# Patient Record
Sex: Female | Born: 1960 | Race: White | Hispanic: No | Marital: Single | Smoking: Former smoker
Health system: Southern US, Community
[De-identification: ages and names within clinical notes are randomized; demographics above are authoritative.]

## PROBLEM LIST (undated history)

## (undated) DIAGNOSIS — F410 Panic disorder [episodic paroxysmal anxiety] without agoraphobia: Secondary | ICD-10-CM

## (undated) DIAGNOSIS — F419 Anxiety disorder, unspecified: Secondary | ICD-10-CM

## (undated) HISTORY — PX: CHOLECYSTECTOMY: SHX55

## (undated) HISTORY — PX: TONSILLECTOMY: SUR1361

## (undated) HISTORY — PX: CARPAL TUNNEL RELEASE: SHX101

---

## 1997-11-03 ENCOUNTER — Other Ambulatory Visit: Admission: RE | Admit: 1997-11-03 | Discharge: 1997-11-03 | Payer: Self-pay | Admitting: Obstetrics and Gynecology

## 2006-01-13 ENCOUNTER — Ambulatory Visit (HOSPITAL_COMMUNITY): Admission: RE | Admit: 2006-01-13 | Discharge: 2006-01-14 | Payer: Self-pay | Admitting: General Surgery

## 2006-01-13 ENCOUNTER — Encounter (INDEPENDENT_AMBULATORY_CARE_PROVIDER_SITE_OTHER): Payer: Self-pay | Admitting: Specialist

## 2007-08-06 ENCOUNTER — Other Ambulatory Visit: Admission: RE | Admit: 2007-08-06 | Discharge: 2007-08-06 | Payer: Self-pay | Admitting: Obstetrics and Gynecology

## 2010-08-26 ENCOUNTER — Encounter (HOSPITAL_BASED_OUTPATIENT_CLINIC_OR_DEPARTMENT_OTHER)
Admission: RE | Admit: 2010-08-26 | Discharge: 2010-08-26 | Disposition: A | Discharge status: Home / Self Care | Payer: BC Managed Care – PPO | Source: Ambulatory Visit | Attending: Orthopedic Surgery | Admitting: Orthopedic Surgery

## 2010-08-26 LAB — BASIC METABOLIC PANEL
CO2: 32 mEq/L (ref 19–32)
Calcium: 9.4 mg/dL (ref 8.4–10.5)
Creatinine, Ser: 0.43 mg/dL (ref 0.4–1.2)
GFR calc Af Amer: >60 mL/min (ref >60)
GFR calc non Af Amer: >60 mL/min (ref >60)
Sodium: 135 mEq/L (ref 135–145)

## 2010-08-27 ENCOUNTER — Ambulatory Visit (HOSPITAL_BASED_OUTPATIENT_CLINIC_OR_DEPARTMENT_OTHER)
Admission: RE | Admit: 2010-08-27 | Discharge: 2010-08-27 | Disposition: A | Discharge status: Home / Self Care | Payer: BC Managed Care – PPO | Source: Ambulatory Visit | Attending: Orthopedic Surgery | Admitting: Orthopedic Surgery

## 2010-08-27 DIAGNOSIS — G5600 Carpal tunnel syndrome, unspecified upper limb: Secondary | ICD-10-CM | POA: Insufficient documentation

## 2010-08-27 DIAGNOSIS — M654 Radial styloid tenosynovitis [de Quervain]: Secondary | ICD-10-CM | POA: Insufficient documentation

## 2010-08-27 DIAGNOSIS — E669 Obesity, unspecified: Secondary | ICD-10-CM | POA: Insufficient documentation

## 2010-08-27 DIAGNOSIS — I10 Essential (primary) hypertension: Secondary | ICD-10-CM | POA: Insufficient documentation

## 2010-08-27 DIAGNOSIS — E119 Type 2 diabetes mellitus without complications: Secondary | ICD-10-CM | POA: Insufficient documentation

## 2010-08-27 DIAGNOSIS — M1990 Unspecified osteoarthritis, unspecified site: Secondary | ICD-10-CM | POA: Insufficient documentation

## 2010-08-27 DIAGNOSIS — Z01812 Encounter for preprocedural laboratory examination: Secondary | ICD-10-CM | POA: Insufficient documentation

## 2010-08-27 DIAGNOSIS — Z01818 Encounter for other preprocedural examination: Secondary | ICD-10-CM | POA: Insufficient documentation

## 2010-08-27 LAB — GLUCOSE, CAPILLARY: Glucose-Capillary: 131 mg/dL — ABNORMAL HIGH (ref 70–99)

## 2010-08-27 LAB — POCT HEMOGLOBIN-HEMACUE: Hemoglobin: 13.9 g/dL (ref 12.0–15.0)

## 2010-08-31 NOTE — Op Note (Signed)
NAMERABECCA, BIRGE               ACCOUNT NO.:  [id number]  MEDICAL RECORD NO.:  [id number]           PATIENT TYPE:  LOCATION:                                 FACILITY:  PHYSICIAN:  Katy Fitch. Lorenzo Pereyra, M.D.      DATE OF BIRTH:  DATE OF PROCEDURE:  08/27/2010 DATE OF DISCHARGE:                              OPERATIVE REPORT   PREOPERATIVE DIAGNOSES: 1. Chronic right carpal tunnel syndrome associated with type 2     diabetes. 2. Acute stenosing tenosynovitis of right first dorsal compartment.  POSTOPERATIVE DIAGNOSES: 1. Chronic right carpal tunnel syndrome associated with type 2     diabetes. 2. Acute stenosing tenosynovitis of right first dorsal compartment.  OPERATIONS: 1. Release of right transcarpal ligament. 2. Release of right first dorsal compartment.  OPERATIONS:  Katy Fitch. Lemarcus Baggerly, MD  ASSISTANT:  Marveen Reeks Dasnoit, PA-C  ANESTHESIA:  General by LMA.  SUPERVISING ANESTHESIOLOGIST:  Janetta Hora. 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 Creola Corn of [facility] for evaluation and management of hand numbness.  Ms. Madelaine Etienne has type 2 diabetes.  She had clinical examination and electrodiagnostic studies noting significant right carpal tunnel syndrome.  She was scheduled for release of right transcarpal ligament at this time.  Preoperatively, we had a detailed informed consent in the holding area. During this consent, we repeated her clinical examination.  She was noted to have acute pain and swelling of the first dorsal compartment. We explained that stenosing tenosynovitis of the common comorbidity and the presence of diabetes and repetitive motion with carpal tunnel syndrome.  Indeed, she had all the clinical signs of significant first dorsal compartment stenosing tenosynovitis including possible Finkelstein maneuver and tenderness on direct palpation.  She could not ulnarly deviate  her wrist or radially abduct her thumb without significant pain.  We advised that we could proceed with release of first dorsal compartment as well as release of the transverse carpal ligament at this time.  Questions were invited and answered in detail.  Permit was amended appropriately.  Questions were invited and answered in detail with her mother present.  PROCEDURE:  Karlissa Aron was brought to room #1 of the [facility] and placed in supine position upon the operating table.  Following induction of general anesthesia by LMA technique under Dr. Thornton Dales direct supervision, the right arm was prepped with Betadine soap and solution, sterilely draped.  A pneumatic tourniquet was applied to the proximal brachium.  The tourniquet was set at 220 mmHg.  Following a routine surgical time-out, the right arm was exsanguinated with an Esmarch bandage and the arterial tourniquet was inflated to 220 mmHg.  Procedure commenced with a short incision in the line of the ring finger and the palm.  Subcutaneous tissues were carefully divided revealing the palmar fascia.  This was split longitudinally to reveal the common sensory branch of the median nerve.  This was followed back to the median nerve proper which was gently isolated from the transverse carpal ligament with a Agricultural engineer.  The ligament was then  released with scissors extending into the distal forearm.  This widely opened the carpal canal.  No mass or other predicaments were noted.  Bleeding points were not problematic.  The wounds were then repaired with intradermal 3-0 Prolene suture.  Attention was directed to the first dorsal compartment.  The extensor retinaculum was palpably thickened.  An incision was fashioned over the area of maximum swelling.  Subcutaneous tissues were carefully divided taking care to identify and gently retract the radial superficial sensory branches.  The compartment was split  at its apex and 3 tendons were identified. There was no septum present between the extensor pollicis brevis and abductor pollicis longus tendons.  Free range of motion of the wrist was recovered.  The wound was then repaired with intradermal 3-0  Prolene and Steri-Strips.  Compressive dressing was applied with Steri-Strips, sterile gauze, sterile Webril, and a volar plaster splint with the wrist in 10 degrees of dorsiflexion.  For aftercare, Ms. Sarbis was provided a prescription for Vicodin 5 mg 1 p.o. q.4-6 h. p.r.n. pain, 20 tablets without refill.  We will see her back for followup in our office in 1 week for suture removal and initiation of her postoperative rehabilitation program.     Katy Fitch. Lolly Glaus, M.D.     RVS/MEDQ  D:  08/27/2010  T:  08/28/2010  Job:  [id number]  cc:   Gwen Pounds, MD  Electronically Signed by Josephine Igo M.D. on 08/31/2010 02:21:29 PM

## 2010-10-29 NOTE — Op Note (Signed)
NAMEESTELENE, CARMACK               ACCOUNT NO.:  [id number]   MEDICAL RECORD NO.:  [id number]          PATIENT TYPE:  OIB   LOCATION:  1607                         FACILITY:  [facility]   PHYSICIAN:  Ollen Gross. Vernell Morgans, M.D. DATE OF BIRTH:  1961/05/29   DATE OF PROCEDURE:  01/13/2006  DATE OF DISCHARGE:  01/14/2006                                 OPERATIVE REPORT   PREOPERATIVE DIAGNOSIS:  Gallstones.   POSTOPERATIVE DIAGNOSIS:  Gallstones.   PROCEDURE:  Laparoscopic cholecystectomy with intraoperative cholangiogram.   SURGEON:  Dr. Carolynne Edouard.   ASSISTANT:  Dr. Orson Slick.   ANESTHESIA:  General endotracheal.   PROCEDURE:  After informed consent was obtained, the patient was brought to  the operating room and placed in the supine position on the operating room  table.  After adequate induction of general anesthesia, the patient's  abdomen was prepped with Betadine and draped in usual sterile manner.  The  area below the umbilicus infiltrated with 0.25% Marcaine.  A small incision  was made with a 15-blade knife.  This incision was carried down through the  subcutaneous tissue bluntly, with a hemostat and Army-Navy retractors, until  the linea alba was identified.  The linea alba was incised with the 15-blade  knife and each side was grasped Kocher clamps and elevated anteriorly.  The  preperitoneal space was probed bluntly with a hemostat until the peritoneum  was opened and access was gained to the abdominal cavity.  A 0-0 Vicryl  pursestring stitch was placed in the fascia surrounding the opening.  Hasson  cannula was placed through the opening and anchored in place with the  previously placed Vicryl pursestring stitch.  The abdomen was then  insufflated carbon dioxide without difficulty.  The patient was placed in  head-up position, rotated slightly with the right side up.  The laparoscope  was inserted through the Hasson cannula, and the right upper quadrant was  inspected.  Dome of  gallbladder and liver were readily identified.  Next,  the epigastric region was infiltrated 0.25% Marcaine.  A small incision was  made with a 15-blade knife and a 10-mm port was placed bluntly through this  incision, into the abdominal cavity, under direct vision.  Sites were then  chosen laterally on the right side of the abdomen for placement of 5-mm  ports.  Each of these areas were infiltrated with 0.25% Marcaine.  Small  stab incisions were made with a 15-blade knife and 5-mm ports were placed  bluntly through these incisions, into the abdominal cavity, under direct  vision.  Blunt grasper was placed through the lateral-most, 5-mm port and  used to grasp the dome of gallbladder and elevate it anteriorly and  superiorly.  Another blunt grasper was placed through the other 5-mm port  and used to retract on the body and neck of the gallbladder.  A dissector  was placed through the epigastric port and, using the electrocautery, the  peritoneal reflection at the gallbladder neck was opened.  Blunt dissection  was then carried out in this area until the gallbladder neck/cystic duct  junction was  readily identified and a good window was created.  A single  clip was placed on the gallbladder neck.  A small ductotomy was made just  below the clip.  A 14-gauge Angiocath was then placed percutaneously through  the anterior abdominal wall, under direct vision.  A Reddick cholangiogram  catheter was placed through the Angiocath and flushed.  The Reddick catheter  was then placed within the cystic duct and anchored in place with a clip.  Cholangiogram was obtained that showed no filling defects, good emptying  into the duodenum, and adequate length on the cystic duct.  The anchoring  clip and catheter were then removed from the patient.  Three clips were  placed proximally on the cystic duct and the duct was divided beneath the  two sets of clips.  Posterior to this, the cystic artery was  identified and  again dissected bluntly, in a circumferential manner until a good window was  created.  Two clips were placed proximally and one distally on the artery,  and the artery was divided between the two.  Next, a laparoscopic hook  cautery device was used to separate the gallbladder from the liver bed.  Prior to completely detaching the gallbladder from liver bed, the liver bed  was inspected and several small bleeding points were coagulated with the  electrocautery until the area was completely hemostatic.  The gallbladder  was then detached the rest of the way from the liver bed without difficulty,  with the hook electrocautery.  The laparoscope was then moved to the  epigastric port.  A gallbladder grasper was placed through the Hasson  cannula and used to grasp the neck of the gallbladder.  The gallbladder with  the Hasson cannula was then removed through the infraumbilical port without  difficulty.  The fascial defect was closed with the previously placed Vicryl  pursestring stitch, as well as with another figure-of-eight 0-0 Vicryl  stitch, as well as with another figure-of-eight 0-0 Vicryl stitch.  The  liver bed was inspected again and found be hemostatic.  The abdomen was  irrigated with copious amounts of saline until the effluent was clear.  The  rest of ports were removed under direct vision and were found be hemostatic.  The gas was allowed to escape.  The skin incisions were all closed with  interrupted 4-0 Monocryl subcuticular stitches.  Benzoin, Steri-Strips and  sterile dressings were applied.  The patient tolerated the procedure well.  At the end of the case all needle, sponge and instrument counts were  correct.  The patient was then awakened and taken to recovery in stable  condition.      Ollen Gross. Vernell Morgans, M.D.  Electronically Signed     PST/MEDQ  D:  01/19/2006  T:  01/19/2006  Job:  [id number]

## 2010-11-16 ENCOUNTER — Emergency Department (HOSPITAL_COMMUNITY)
Admission: EM | Admit: 2010-11-16 | Discharge: 2010-11-16 | Disposition: A | Discharge status: Home / Self Care | Payer: BC Managed Care – PPO | Attending: Emergency Medicine | Admitting: Emergency Medicine

## 2010-11-16 DIAGNOSIS — F411 Generalized anxiety disorder: Secondary | ICD-10-CM | POA: Insufficient documentation

## 2010-11-16 DIAGNOSIS — I10 Essential (primary) hypertension: Secondary | ICD-10-CM | POA: Insufficient documentation

## 2010-11-16 DIAGNOSIS — R21 Rash and other nonspecific skin eruption: Secondary | ICD-10-CM | POA: Insufficient documentation

## 2010-11-16 DIAGNOSIS — E7800 Pure hypercholesterolemia, unspecified: Secondary | ICD-10-CM | POA: Insufficient documentation

## 2010-11-16 DIAGNOSIS — E119 Type 2 diabetes mellitus without complications: Secondary | ICD-10-CM | POA: Insufficient documentation

## 2010-11-16 DIAGNOSIS — L301 Dyshidrosis [pompholyx]: Secondary | ICD-10-CM | POA: Insufficient documentation

## 2011-09-16 ENCOUNTER — Emergency Department (HOSPITAL_COMMUNITY)
Admission: EM | Admit: 2011-09-16 | Discharge: 2011-09-17 | Disposition: A | Discharge status: Other Acute Inpatient Hospital | Payer: BC Managed Care – PPO | Attending: Emergency Medicine | Admitting: Emergency Medicine

## 2011-09-16 ENCOUNTER — Other Ambulatory Visit: Payer: Self-pay

## 2011-09-16 ENCOUNTER — Encounter (HOSPITAL_COMMUNITY): Payer: Self-pay | Admitting: Emergency Medicine

## 2011-09-16 DIAGNOSIS — T424X4A Poisoning by benzodiazepines, undetermined, initial encounter: Secondary | ICD-10-CM | POA: Insufficient documentation

## 2011-09-16 DIAGNOSIS — R45851 Suicidal ideations: Secondary | ICD-10-CM

## 2011-09-16 DIAGNOSIS — IMO0002 Reserved for concepts with insufficient information to code with codable children: Secondary | ICD-10-CM | POA: Insufficient documentation

## 2011-09-16 DIAGNOSIS — F410 Panic disorder [episodic paroxysmal anxiety] without agoraphobia: Secondary | ICD-10-CM | POA: Insufficient documentation

## 2011-09-16 DIAGNOSIS — T43502A Poisoning by unspecified antipsychotics and neuroleptics, intentional self-harm, initial encounter: Secondary | ICD-10-CM | POA: Insufficient documentation

## 2011-09-16 DIAGNOSIS — X789XXA Intentional self-harm by unspecified sharp object, initial encounter: Secondary | ICD-10-CM | POA: Insufficient documentation

## 2011-09-16 DIAGNOSIS — T50902A Poisoning by unspecified drugs, medicaments and biological substances, intentional self-harm, initial encounter: Secondary | ICD-10-CM

## 2011-09-16 DIAGNOSIS — E119 Type 2 diabetes mellitus without complications: Secondary | ICD-10-CM | POA: Insufficient documentation

## 2011-09-16 DIAGNOSIS — F411 Generalized anxiety disorder: Secondary | ICD-10-CM | POA: Insufficient documentation

## 2011-09-16 DIAGNOSIS — S60819A Abrasion of unspecified wrist, initial encounter: Secondary | ICD-10-CM

## 2011-09-16 HISTORY — DX: Panic disorder (episodic paroxysmal anxiety): F41.0

## 2011-09-16 HISTORY — DX: Anxiety disorder, unspecified: F41.9

## 2011-09-16 LAB — BASIC METABOLIC PANEL
BUN: 12 mg/dL (ref 6–23)
Calcium: 9.6 mg/dL (ref 8.4–10.5)
GFR calc Af Amer: >90 mL/min (ref >90)
GFR calc non Af Amer: >90 mL/min (ref >90)
Glucose, Bld: 158 mg/dL — ABNORMAL HIGH (ref 70–99)

## 2011-09-16 LAB — ETHANOL: Alcohol, Ethyl (B): <11 mg/dL (ref 0–11)

## 2011-09-16 LAB — RAPID URINE DRUG SCREEN, HOSP PERFORMED
Amphetamines: NOT DETECTED
Barbiturates: NOT DETECTED
Tetrahydrocannabinol: NOT DETECTED

## 2011-09-16 LAB — CBC
HCT: 38.1 % (ref 36.0–46.0)
Hemoglobin: 13.9 g/dL (ref 12.0–15.0)
MCH: 31.4 pg (ref 26.0–34.0)
MCHC: 36.5 g/dL — ABNORMAL HIGH (ref 30.0–36.0)
RDW: 12.5 % (ref 11.5–15.5)

## 2011-09-16 MED ORDER — ACETAMINOPHEN 325 MG PO TABS
650.0000 mg | ORAL_TABLET | ORAL | Status: DC | PRN
  Administered 2011-09-16: 650 mg via ORAL
  Filled 2011-09-16: qty 2

## 2011-09-16 MED ORDER — ONDANSETRON HCL 4 MG PO TABS: 4.0000 mg | ORAL_TABLET | Freq: Three times a day (TID) | ORAL | Status: DC | PRN

## 2011-09-16 MED ORDER — ZOLPIDEM TARTRATE 5 MG PO TABS: 5.0000 mg | ORAL_TABLET | Freq: Every evening | ORAL | Status: DC | PRN

## 2011-09-16 MED ORDER — IBUPROFEN 600 MG PO TABS: 600.0000 mg | ORAL_TABLET | Freq: Three times a day (TID) | ORAL | Status: DC | PRN

## 2011-09-16 NOTE — ED Notes (Signed)
To ED via [facility]-- c/o ativan overdose-- took "a handful- 5 or 10" per patient. Denies taking any other medicine, also has superficial cuts to left wrist
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
## 2011-09-16 NOTE — ED Notes (Addendum)
Pt alert and oriented x4. Respirations even and unlabored, bilateral symmetrical rise and fall of chest. Skin warm and dry. In no acute distress. Denies needs.  Pt on menstrual period. Provided with disposable briefs, pad, and new paper scrub pants.

## 2011-09-16 NOTE — ED Notes (Signed)
Lorazepam bottle with pills given to [facility]. Taken to Pharmacy to be counted and stored.

## 2011-09-16 NOTE — ED Notes (Signed)
rn will get report and move pt to pysch ed. md medically cleared pt
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
## 2011-09-16 NOTE — BH Assessment (Signed)
Assessment Note   Pt has been accepted to [facility] by Dr. Abbey Chatters.   Peggy Ryan A 09/16/2011 9:36 PM

## 2011-09-16 NOTE — ED Notes (Signed)
Pt accepted to [facility]  By Dr Darlys Gales- in the [place] building. Edp Yelverton made aware by [employer]

## 2011-09-16 NOTE — ED Notes (Signed)
Ativan sent to pharmacy- [facility] counted pills--63.5 pills remain. Had rx filled on 3/25.

## 2011-09-16 NOTE — ED Notes (Addendum)
md alerted pt is diabetic. At this time no diabetic protocol ordered. Pt does not take insulin as part of her medication listed.

## 2011-09-17 MED ORDER — LORAZEPAM 0.5 MG PO TABS
0.2500 mg | ORAL_TABLET | Freq: Four times a day (QID) | ORAL | Status: DC
  Administered 2011-09-17: 0.5 mg via ORAL
  Filled 2011-09-17: qty 1

## 2011-09-17 MED ORDER — GLIMEPIRIDE 1 MG PO TABS: 1.0000 mg | ORAL_TABLET | Freq: Every day | ORAL | Status: DC

## 2011-09-17 MED ORDER — METFORMIN HCL 500 MG PO TABS
500.0000 mg | ORAL_TABLET | Freq: Two times a day (BID) | ORAL | Status: DC
  Filled 2011-09-17: qty 1

## 2011-09-17 MED ORDER — LISINOPRIL 10 MG PO TABS
10.0000 mg | ORAL_TABLET | Freq: Every day | ORAL | Status: DC
  Administered 2011-09-17: 10 mg via ORAL
  Filled 2011-09-17: qty 1

## 2011-09-17 MED ORDER — HYDROCHLOROTHIAZIDE 25 MG PO TABS
25.0000 mg | ORAL_TABLET | Freq: Every day | ORAL | Status: DC
  Administered 2011-09-17: 25 mg via ORAL
  Filled 2011-09-17: qty 1

## 2011-09-17 MED ORDER — BUPROPION HCL 75 MG PO TABS
75.0000 mg | ORAL_TABLET | Freq: Two times a day (BID) | ORAL | Status: DC
  Administered 2011-09-17: 37.5 mg via ORAL
  Filled 2011-09-17 (×2): qty 1

## 2011-09-17 NOTE — ED Notes (Signed)
Confirmed with [facility] Intake department that pt is IVC for transportation purposes and she's going to the [facility].
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
## 2011-09-17 NOTE — ED Notes (Signed)
[facility] not able to transport pt due to no medical necessity Judeth [place] act made aware and will fill out IVC papers for transport in the am by [facility] department

## 2011-09-17 NOTE — ED Notes (Signed)
Pt has been transported to [facility] via [place] due to [employer].

## 2011-09-17 NOTE — ED Notes (Signed)
Pt was very upset, tearful, asking for her home medications that hadn't been ordered for her psychiatric meds, HTN and DM II. MD ordered meds.

## 2014-08-05 DIAGNOSIS — F332 Major depressive disorder, recurrent severe without psychotic features: Secondary | ICD-10-CM | POA: Diagnosis not present

## 2014-09-02 DIAGNOSIS — Z6831 Body mass index (BMI) 31.0-31.9, adult: Secondary | ICD-10-CM | POA: Diagnosis not present

## 2014-09-02 DIAGNOSIS — M25511 Pain in right shoulder: Secondary | ICD-10-CM | POA: Diagnosis not present

## 2014-10-28 DIAGNOSIS — M25511 Pain in right shoulder: Secondary | ICD-10-CM | POA: Diagnosis not present

## 2014-10-28 DIAGNOSIS — M47812 Spondylosis without myelopathy or radiculopathy, cervical region: Secondary | ICD-10-CM | POA: Diagnosis not present

## 2015-01-05 DIAGNOSIS — I10 Essential (primary) hypertension: Secondary | ICD-10-CM | POA: Diagnosis not present

## 2015-01-05 DIAGNOSIS — E785 Hyperlipidemia, unspecified: Secondary | ICD-10-CM | POA: Diagnosis not present

## 2015-01-05 DIAGNOSIS — E119 Type 2 diabetes mellitus without complications: Secondary | ICD-10-CM | POA: Diagnosis not present

## 2015-01-13 DIAGNOSIS — Z6832 Body mass index (BMI) 32.0-32.9, adult: Secondary | ICD-10-CM | POA: Diagnosis not present

## 2015-01-13 DIAGNOSIS — F329 Major depressive disorder, single episode, unspecified: Secondary | ICD-10-CM | POA: Diagnosis not present

## 2015-01-13 DIAGNOSIS — M25511 Pain in right shoulder: Secondary | ICD-10-CM | POA: Diagnosis not present

## 2015-01-13 DIAGNOSIS — E1165 Type 2 diabetes mellitus with hyperglycemia: Secondary | ICD-10-CM | POA: Diagnosis not present

## 2015-01-13 DIAGNOSIS — Z0000 Encounter for general adult medical examination without abnormal findings: Secondary | ICD-10-CM | POA: Diagnosis not present

## 2015-01-13 DIAGNOSIS — E669 Obesity, unspecified: Secondary | ICD-10-CM | POA: Diagnosis not present

## 2015-01-13 DIAGNOSIS — R42 Dizziness and giddiness: Secondary | ICD-10-CM | POA: Diagnosis not present

## 2015-01-13 DIAGNOSIS — K760 Fatty (change of) liver, not elsewhere classified: Secondary | ICD-10-CM | POA: Diagnosis not present

## 2015-01-22 ENCOUNTER — Other Ambulatory Visit: Payer: Self-pay | Admitting: Internal Medicine

## 2015-01-22 DIAGNOSIS — Z1231 Encounter for screening mammogram for malignant neoplasm of breast: Secondary | ICD-10-CM

## 2015-03-03 DIAGNOSIS — F332 Major depressive disorder, recurrent severe without psychotic features: Secondary | ICD-10-CM | POA: Diagnosis not present

## 2015-09-01 DIAGNOSIS — F332 Major depressive disorder, recurrent severe without psychotic features: Secondary | ICD-10-CM | POA: Diagnosis not present

## 2015-09-14 DIAGNOSIS — E668 Other obesity: Secondary | ICD-10-CM | POA: Diagnosis not present

## 2015-09-14 DIAGNOSIS — F329 Major depressive disorder, single episode, unspecified: Secondary | ICD-10-CM | POA: Diagnosis not present

## 2015-09-14 DIAGNOSIS — I10 Essential (primary) hypertension: Secondary | ICD-10-CM | POA: Diagnosis not present

## 2015-09-14 DIAGNOSIS — E1165 Type 2 diabetes mellitus with hyperglycemia: Secondary | ICD-10-CM | POA: Diagnosis not present

## 2015-09-14 DIAGNOSIS — Z6831 Body mass index (BMI) 31.0-31.9, adult: Secondary | ICD-10-CM | POA: Diagnosis not present

## 2016-01-08 DIAGNOSIS — E784 Other hyperlipidemia: Secondary | ICD-10-CM | POA: Diagnosis not present

## 2016-01-08 DIAGNOSIS — I10 Essential (primary) hypertension: Secondary | ICD-10-CM | POA: Diagnosis not present

## 2016-01-08 DIAGNOSIS — E1165 Type 2 diabetes mellitus with hyperglycemia: Secondary | ICD-10-CM | POA: Diagnosis not present

## 2016-01-15 DIAGNOSIS — I10 Essential (primary) hypertension: Secondary | ICD-10-CM | POA: Diagnosis not present

## 2016-01-15 DIAGNOSIS — G5601 Carpal tunnel syndrome, right upper limb: Secondary | ICD-10-CM | POA: Diagnosis not present

## 2016-01-15 DIAGNOSIS — E668 Other obesity: Secondary | ICD-10-CM | POA: Diagnosis not present

## 2016-01-15 DIAGNOSIS — E784 Other hyperlipidemia: Secondary | ICD-10-CM | POA: Diagnosis not present

## 2016-01-15 DIAGNOSIS — I7389 Other specified peripheral vascular diseases: Secondary | ICD-10-CM | POA: Diagnosis not present

## 2016-01-15 DIAGNOSIS — R0789 Other chest pain: Secondary | ICD-10-CM | POA: Diagnosis not present

## 2016-01-15 DIAGNOSIS — E1151 Type 2 diabetes mellitus with diabetic peripheral angiopathy without gangrene: Secondary | ICD-10-CM | POA: Diagnosis not present

## 2016-01-15 DIAGNOSIS — Z0000 Encounter for general adult medical examination without abnormal findings: Secondary | ICD-10-CM | POA: Diagnosis not present

## 2016-01-15 DIAGNOSIS — F329 Major depressive disorder, single episode, unspecified: Secondary | ICD-10-CM | POA: Diagnosis not present

## 2016-01-27 DIAGNOSIS — H524 Presbyopia: Secondary | ICD-10-CM | POA: Diagnosis not present

## 2016-01-27 DIAGNOSIS — E118 Type 2 diabetes mellitus with unspecified complications: Secondary | ICD-10-CM | POA: Diagnosis not present

## 2016-01-27 DIAGNOSIS — H5210 Myopia, unspecified eye: Secondary | ICD-10-CM | POA: Diagnosis not present

## 2016-02-24 DIAGNOSIS — L821 Other seborrheic keratosis: Secondary | ICD-10-CM | POA: Diagnosis not present

## 2016-02-24 DIAGNOSIS — D235 Other benign neoplasm of skin of trunk: Secondary | ICD-10-CM | POA: Diagnosis not present

## 2016-02-24 DIAGNOSIS — L281 Prurigo nodularis: Secondary | ICD-10-CM | POA: Diagnosis not present

## 2016-02-24 DIAGNOSIS — D1801 Hemangioma of skin and subcutaneous tissue: Secondary | ICD-10-CM | POA: Diagnosis not present

## 2016-02-24 DIAGNOSIS — L309 Dermatitis, unspecified: Secondary | ICD-10-CM | POA: Diagnosis not present

## 2016-03-01 DIAGNOSIS — F332 Major depressive disorder, recurrent severe without psychotic features: Secondary | ICD-10-CM | POA: Diagnosis not present

## 2016-03-10 ENCOUNTER — Emergency Department (HOSPITAL_COMMUNITY)
Admission: EM | Admit: 2016-03-10 | Discharge: 2016-03-10 | Disposition: A | Discharge status: Home / Self Care | Payer: Commercial Managed Care - HMO | Attending: Emergency Medicine | Admitting: Emergency Medicine

## 2016-03-10 ENCOUNTER — Encounter (HOSPITAL_COMMUNITY): Payer: Self-pay

## 2016-03-10 DIAGNOSIS — Z87891 Personal history of nicotine dependence: Secondary | ICD-10-CM | POA: Diagnosis not present

## 2016-03-10 DIAGNOSIS — E119 Type 2 diabetes mellitus without complications: Secondary | ICD-10-CM | POA: Insufficient documentation

## 2016-03-10 DIAGNOSIS — Z79899 Other long term (current) drug therapy: Secondary | ICD-10-CM | POA: Diagnosis not present

## 2016-03-10 DIAGNOSIS — Z7984 Long term (current) use of oral hypoglycemic drugs: Secondary | ICD-10-CM | POA: Diagnosis not present

## 2016-03-10 DIAGNOSIS — Z91040 Latex allergy status: Secondary | ICD-10-CM | POA: Insufficient documentation

## 2016-03-10 DIAGNOSIS — G4753 Recurrent isolated sleep paralysis: Secondary | ICD-10-CM | POA: Diagnosis not present

## 2016-03-10 DIAGNOSIS — F419 Anxiety disorder, unspecified: Secondary | ICD-10-CM | POA: Insufficient documentation

## 2016-03-10 DIAGNOSIS — R251 Tremor, unspecified: Secondary | ICD-10-CM | POA: Diagnosis not present

## 2016-03-10 DIAGNOSIS — R1310 Dysphagia, unspecified: Secondary | ICD-10-CM | POA: Diagnosis not present

## 2016-03-10 DIAGNOSIS — I6789 Other cerebrovascular disease: Secondary | ICD-10-CM | POA: Diagnosis not present

## 2016-03-10 DIAGNOSIS — G478 Other sleep disorders: Secondary | ICD-10-CM

## 2016-03-10 LAB — CBG MONITORING, ED: Glucose-Capillary: 263 mg/dL — ABNORMAL HIGH (ref 65–99)

## 2016-03-10 NOTE — ED Triage Notes (Addendum)
Pt states awake from a nightmare. HX of panic attack. (hypervental, shaking, could not speak) Pt states "it felt like an attack" Pt states she feels a thousand times better however concerns about a stroke. EMS ceared NEG FOR STROKE. PT PRESENT NO STROKE SYMPTOMS. Neuro intact. PEERL, No slurred speech. Pt took ativan before arrival to ED. EDPA WILL present to evaluate pt in triage.

## 2016-03-10 NOTE — ED Notes (Signed)
EDPA WILL PRESENT at bedside.

## 2016-03-10 NOTE — ED Notes (Signed)
PER EDPA WILL- NO ORDERS AT THIS TIME. PT CAN BE PLACED BACK IN WAITING AREA

## 2016-03-10 NOTE — ED Provider Notes (Signed)
[facility] DEPT Provider Note   CSN: [id number] Arrival date & time: 03/10/16  1050     History   Chief Complaint Chief Complaint  Patient presents with  . Anxiety    HPI Peggy Ryan is a 55 y.o. female.  Peggy Ryan is a 55 y.o. Female who presents to the ED complaining of anxiety. Patient reports she was having a nightmare when she began to wake up and for a few seconds she felt her whole body was paralyzed. She then was able to move but was very anxious and thought she might have had a stroke. She took some ativan and called EMS who brought her to the ER. She currently reports she is feeling much better. She still feels anxious. She denies any weakness, numbness or tingling. Her earlier symptoms have resolved. She does report a history of panic disorder and feels like she had a panic attack. She denies history of stroke. Patient denies fevers, chest pain, shortness of breath, abdominal pain, nausea, vomiting, diarrhea, rashes, urinary symptoms.    The history is provided by the patient and the EMS personnel. No language interpreter was used.  Anxiety  Pertinent negatives include no chest pain, no abdominal pain, no headaches and no shortness of breath.    Past Medical History:  Diagnosis Date  . Anxiety   . Diabetes mellitus   . Panic attacks     There are no active problems to display for this patient.   Past Surgical History:  Procedure Laterality Date  . CARPAL TUNNEL RELEASE    . CHOLECYSTECTOMY    . TONSILLECTOMY      OB History    No data available       Home Medications    Prior to Admission medications   Medication Sig Start Date End Date Taking? Authorizing Provider  buPROPion (WELLBUTRIN) 75 MG tablet Take 75 mg by mouth 2 (two) times daily.    Historical Provider, MD  doxycycline (VIBRA-TABS) 100 MG tablet Take 100 mg by mouth 2 (two) times daily.    Historical Provider, MD  fenofibrate (TRICOR) 145 MG tablet Take 145 mg by mouth daily.     Historical Provider, MD  glimepiride (AMARYL) 1 MG tablet Take 1 mg by mouth daily before breakfast.    Historical Provider, MD  hydrochlorothiazide (HYDRODIURIL) 25 MG tablet Take 25 mg by mouth daily.    Historical Provider, MD  lisinopril (PRINIVIL,ZESTRIL) 10 MG tablet Take 10 mg by mouth daily.    Historical Provider, MD  LORazepam (ATIVAN) 0.5 MG tablet Take 0.25-0.5 mg by mouth 4 (four) times daily.    Historical Provider, MD  metFORMIN (GLUCOPHAGE) 500 MG tablet Take 500 mg by mouth 2 (two) times daily with a meal.    Historical Provider, MD  tretinoin (RETIN-A) 0.05 % cream Apply 1 application topically at bedtime.    Historical Provider, MD    Family History No family history on file.  Social History Social History  Substance Use Topics  . Smoking status: Former Research scientist (life sciences)  . Smokeless tobacco: Former Systems developer    Quit date: 09/15/2008  . Alcohol use Not on file     Comment: socially     Allergies   Levofloxacin; Latex; and Sulfa antibiotics   Review of Systems Review of Systems  Constitutional: Negative for chills and fever.  HENT: Negative for congestion and sore throat.   Eyes: Negative for visual disturbance.  Respiratory: Negative for cough and shortness of breath.   Cardiovascular:  Negative for chest pain.  Gastrointestinal: Negative for abdominal pain, diarrhea, nausea and vomiting.  Genitourinary: Negative for dysuria.  Musculoskeletal: Negative for back pain and neck pain.  Skin: Negative for rash.  Neurological: Negative for dizziness, syncope, weakness, light-headedness, numbness and headaches.  Psychiatric/Behavioral: Negative for suicidal ideas. The patient is nervous/anxious.      Physical Exam Updated Vital Signs BP 117/70 (BP Location: Right Arm)   Pulse 106   Temp 98.2 F (36.8 C) (Oral)   Resp 18   Ht 5\' 1"  (1.549 m)   Wt 79.4 kg   SpO2 95%   BMI 33.07 kg/m   Physical Exam  Constitutional: She is oriented to person, place, and time. She  appears well-developed and well-nourished. No distress.  Nontoxic appearing.  HENT:  Head: Normocephalic and atraumatic.  Right Ear: External ear normal.  Left Ear: External ear normal.  Mouth/Throat: Oropharynx is clear and moist.  Eyes: Conjunctivae and EOM are normal. Pupils are equal, round, and reactive to light. Right eye exhibits no discharge. Left eye exhibits no discharge.  Neck: Neck supple.  Cardiovascular: Normal rate, regular rhythm, normal heart sounds and intact distal pulses.  Exam reveals no gallop and no friction rub.   No murmur heard. Pulmonary/Chest: Effort normal and breath sounds normal. No respiratory distress. She has no wheezes. She has no rales.  Abdominal: Soft. There is no tenderness. There is no guarding.  Musculoskeletal: Normal range of motion. She exhibits no edema, tenderness or deformity.  Good strength to her bilateral upper and lower extremities. No lower extremity edema or tenderness.  Lymphadenopathy:    She has no cervical adenopathy.  Neurological: She is alert and oriented to person, place, and time. No cranial nerve deficit. Coordination normal.  Patient is alert and oriented 3. Cranial nerves are intact. Speech is clear and coherent. EOMs are intact. Vision is grossly intact. Normal gait. Sensation is intact her bilateral upper and lower extremities. Finger to nose is intact.  Skin: Skin is warm and dry. Capillary refill takes less than 2 seconds. No rash noted. She is not diaphoretic. No erythema. No pallor.  Psychiatric: Her behavior is normal. Her mood appears anxious.  Patient appears anxious.  Nursing note and vitals reviewed.    ED Treatments / Results  Labs (all labs ordered are listed, but only abnormal results are displayed) Labs Reviewed  CBG MONITORING, ED - Abnormal; Notable for the following:       Result Value   Glucose-Capillary 263 (*)    All other components within normal limits    EKG  EKG Interpretation None        Radiology No results found.  Procedures Procedures (including critical care time)  Medications Ordered in ED Medications - No data to display   Initial Impression / Assessment and Plan / ED Course  I have reviewed the triage vital signs and the nursing notes.  Pertinent labs & imaging results that were available during my care of the patient were reviewed by me and considered in my medical decision making (see chart for details).  Clinical Course   This is a 55 y.o. Female who presents to the ED complaining of anxiety. Patient reports she was having a nightmare when she began to wake up and for a few seconds she felt her whole body was paralyzed. She then was able to move but was very anxious and thought she might have had a stroke. She took some ativan and called EMS who brought her  to the ER. She currently reports she is feeling much better. She still feels anxious. She denies any weakness, numbness or tingling. Her earlier symptoms have resolved. She does report a history of panic disorder and feels like she had a panic attack. On exam the patient is afebrile nontoxic appearing. She has no focal neurological deficits. She is able to ambulate without difficulty or assistance. Patient describes sleep paralysis. Patient is likely coming out of REM sleep and had some brief sleep paralysis. She has no focal neurological deficits. She feels back at her baseline. After I explained what I believed happened patient is laughing and reports she feels much better. She is followed by psychiatry. I encouraged her to follow-up with her psychiatrist to discuss the symptoms and possibly discuss medications for nightmares. I discussed return precautions. I advised the patient to follow-up with their primary care provider this week. I advised the patient to return to the emergency department with new or worsening symptoms or new concerns. The patient verbalized understanding and agreement with plan.     This patient was discussed with Dr. Vanita Panda who agrees with assessment and plan.   Final Clinical Impressions(s) / ED Diagnoses   Final diagnoses:  Sleep paralysis  Anxiety    New Prescriptions New Prescriptions   No medications on file     Waynetta Pean, Hershal Coria 03/10/16 1237    Carmin Muskrat, MD 03/10/16 1743

## 2016-03-10 NOTE — ED Notes (Signed)
Bed: WTR5 Expected date:  Expected time:  Means of arrival:  Comments: 

## 2016-03-10 NOTE — Discharge Instructions (Signed)
°   Sleep paralysis is the complete inability to move for one or two minutes immediately after awakening. It may also occur just before falling asleep. Episodes of sleep paralysis can be frightening because the immobility may be accompanied by hypnopompic hallucinations or a sensation of suffocation. The feeling of suffocation may be related to slight reductions in tidal volume that occur during sleep paralysis. Sleep paralysis can be distinguished from cataplexy because sleep paralysis occurs upon awakening, while cataplexy is triggered by positive emotions. Sleep paralysis and hypnagogic hallucinations are common in patients with narcolepsy but not specific for the diagnosis. Isolated sleep paralysis can occur in association with insufficient sleep, irregular sleep-wake schedules, or as a rebound phenomenon after withdrawal of REM sleep-suppressing medications or substances. About 20 percent of the general population have a rare episode of sleep paralysis, perhaps once or twice over several years, but people with narcolepsy tend to have these symptoms much more frequently.

## 2016-09-20 DIAGNOSIS — Z6831 Body mass index (BMI) 31.0-31.9, adult: Secondary | ICD-10-CM | POA: Diagnosis not present

## 2016-09-20 DIAGNOSIS — E668 Other obesity: Secondary | ICD-10-CM | POA: Diagnosis not present

## 2016-09-20 DIAGNOSIS — F17210 Nicotine dependence, cigarettes, uncomplicated: Secondary | ICD-10-CM | POA: Diagnosis not present

## 2016-09-20 DIAGNOSIS — E1165 Type 2 diabetes mellitus with hyperglycemia: Secondary | ICD-10-CM | POA: Diagnosis not present

## 2016-09-20 DIAGNOSIS — I7389 Other specified peripheral vascular diseases: Secondary | ICD-10-CM | POA: Diagnosis not present

## 2016-09-20 DIAGNOSIS — I10 Essential (primary) hypertension: Secondary | ICD-10-CM | POA: Diagnosis not present

## 2016-09-20 DIAGNOSIS — F3289 Other specified depressive episodes: Secondary | ICD-10-CM | POA: Diagnosis not present

## 2016-11-08 DIAGNOSIS — F332 Major depressive disorder, recurrent severe without psychotic features: Secondary | ICD-10-CM | POA: Diagnosis not present

## 2017-02-14 DIAGNOSIS — M9903 Segmental and somatic dysfunction of lumbar region: Secondary | ICD-10-CM | POA: Diagnosis not present

## 2017-02-14 DIAGNOSIS — M461 Sacroiliitis, not elsewhere classified: Secondary | ICD-10-CM | POA: Diagnosis not present

## 2017-02-14 DIAGNOSIS — M9904 Segmental and somatic dysfunction of sacral region: Secondary | ICD-10-CM | POA: Diagnosis not present

## 2017-02-14 DIAGNOSIS — M9905 Segmental and somatic dysfunction of pelvic region: Secondary | ICD-10-CM | POA: Diagnosis not present

## 2017-02-14 DIAGNOSIS — M5137 Other intervertebral disc degeneration, lumbosacral region: Secondary | ICD-10-CM | POA: Diagnosis not present

## 2017-02-16 DIAGNOSIS — M9903 Segmental and somatic dysfunction of lumbar region: Secondary | ICD-10-CM | POA: Diagnosis not present

## 2017-02-16 DIAGNOSIS — M9905 Segmental and somatic dysfunction of pelvic region: Secondary | ICD-10-CM | POA: Diagnosis not present

## 2017-02-16 DIAGNOSIS — M5137 Other intervertebral disc degeneration, lumbosacral region: Secondary | ICD-10-CM | POA: Diagnosis not present

## 2017-02-16 DIAGNOSIS — M461 Sacroiliitis, not elsewhere classified: Secondary | ICD-10-CM | POA: Diagnosis not present

## 2017-02-16 DIAGNOSIS — M9904 Segmental and somatic dysfunction of sacral region: Secondary | ICD-10-CM | POA: Diagnosis not present

## 2017-03-10 DIAGNOSIS — E784 Other hyperlipidemia: Secondary | ICD-10-CM | POA: Diagnosis not present

## 2017-03-10 DIAGNOSIS — E1165 Type 2 diabetes mellitus with hyperglycemia: Secondary | ICD-10-CM | POA: Diagnosis not present

## 2017-03-10 DIAGNOSIS — I10 Essential (primary) hypertension: Secondary | ICD-10-CM | POA: Diagnosis not present

## 2017-03-16 DIAGNOSIS — R0609 Other forms of dyspnea: Secondary | ICD-10-CM | POA: Diagnosis not present

## 2017-03-16 DIAGNOSIS — R0789 Other chest pain: Secondary | ICD-10-CM | POA: Diagnosis not present

## 2017-03-16 DIAGNOSIS — I7389 Other specified peripheral vascular diseases: Secondary | ICD-10-CM | POA: Diagnosis not present

## 2017-03-16 DIAGNOSIS — E668 Other obesity: Secondary | ICD-10-CM | POA: Diagnosis not present

## 2017-03-16 DIAGNOSIS — Z6830 Body mass index (BMI) 30.0-30.9, adult: Secondary | ICD-10-CM | POA: Diagnosis not present

## 2017-03-16 DIAGNOSIS — Z23 Encounter for immunization: Secondary | ICD-10-CM | POA: Diagnosis not present

## 2017-03-16 DIAGNOSIS — Z0000 Encounter for general adult medical examination without abnormal findings: Secondary | ICD-10-CM | POA: Diagnosis not present

## 2017-03-16 DIAGNOSIS — F3289 Other specified depressive episodes: Secondary | ICD-10-CM | POA: Diagnosis not present

## 2017-03-16 DIAGNOSIS — E1165 Type 2 diabetes mellitus with hyperglycemia: Secondary | ICD-10-CM | POA: Diagnosis not present

## 2017-03-16 DIAGNOSIS — R5383 Other fatigue: Secondary | ICD-10-CM | POA: Diagnosis not present

## 2017-03-16 DIAGNOSIS — E7849 Other hyperlipidemia: Secondary | ICD-10-CM | POA: Diagnosis not present

## 2017-03-16 DIAGNOSIS — M545 Low back pain: Secondary | ICD-10-CM | POA: Diagnosis not present

## 2017-03-23 DIAGNOSIS — F332 Major depressive disorder, recurrent severe without psychotic features: Secondary | ICD-10-CM | POA: Diagnosis not present

## 2017-03-24 DIAGNOSIS — Z1212 Encounter for screening for malignant neoplasm of rectum: Secondary | ICD-10-CM | POA: Diagnosis not present

## 2017-04-04 DIAGNOSIS — M79604 Pain in right leg: Secondary | ICD-10-CM | POA: Diagnosis not present

## 2017-04-04 DIAGNOSIS — M6281 Muscle weakness (generalized): Secondary | ICD-10-CM | POA: Diagnosis not present

## 2017-04-04 DIAGNOSIS — R293 Abnormal posture: Secondary | ICD-10-CM | POA: Diagnosis not present

## 2017-04-04 DIAGNOSIS — M545 Low back pain: Secondary | ICD-10-CM | POA: Diagnosis not present

## 2017-04-11 DIAGNOSIS — M6281 Muscle weakness (generalized): Secondary | ICD-10-CM | POA: Diagnosis not present

## 2017-04-11 DIAGNOSIS — M545 Low back pain: Secondary | ICD-10-CM | POA: Diagnosis not present

## 2017-04-11 DIAGNOSIS — M79604 Pain in right leg: Secondary | ICD-10-CM | POA: Diagnosis not present

## 2017-04-11 DIAGNOSIS — R293 Abnormal posture: Secondary | ICD-10-CM | POA: Diagnosis not present

## 2017-04-13 DIAGNOSIS — R293 Abnormal posture: Secondary | ICD-10-CM | POA: Diagnosis not present

## 2017-04-13 DIAGNOSIS — M545 Low back pain: Secondary | ICD-10-CM | POA: Diagnosis not present

## 2017-04-13 DIAGNOSIS — M6281 Muscle weakness (generalized): Secondary | ICD-10-CM | POA: Diagnosis not present

## 2017-04-13 DIAGNOSIS — M79604 Pain in right leg: Secondary | ICD-10-CM | POA: Diagnosis not present

## 2017-07-25 DIAGNOSIS — F332 Major depressive disorder, recurrent severe without psychotic features: Secondary | ICD-10-CM | POA: Diagnosis not present

## 2017-08-21 DIAGNOSIS — M25551 Pain in right hip: Secondary | ICD-10-CM | POA: Diagnosis not present

## 2017-08-21 DIAGNOSIS — M545 Low back pain: Secondary | ICD-10-CM | POA: Diagnosis not present

## 2017-08-21 DIAGNOSIS — R262 Difficulty in walking, not elsewhere classified: Secondary | ICD-10-CM | POA: Diagnosis not present

## 2017-08-21 DIAGNOSIS — M6281 Muscle weakness (generalized): Secondary | ICD-10-CM | POA: Diagnosis not present

## 2017-08-24 DIAGNOSIS — M25551 Pain in right hip: Secondary | ICD-10-CM | POA: Diagnosis not present

## 2017-08-24 DIAGNOSIS — M545 Low back pain: Secondary | ICD-10-CM | POA: Diagnosis not present

## 2017-08-24 DIAGNOSIS — R262 Difficulty in walking, not elsewhere classified: Secondary | ICD-10-CM | POA: Diagnosis not present

## 2017-08-24 DIAGNOSIS — M6281 Muscle weakness (generalized): Secondary | ICD-10-CM | POA: Diagnosis not present

## 2017-08-31 DIAGNOSIS — R262 Difficulty in walking, not elsewhere classified: Secondary | ICD-10-CM | POA: Diagnosis not present

## 2017-08-31 DIAGNOSIS — M25551 Pain in right hip: Secondary | ICD-10-CM | POA: Diagnosis not present

## 2017-08-31 DIAGNOSIS — M545 Low back pain: Secondary | ICD-10-CM | POA: Diagnosis not present

## 2017-08-31 DIAGNOSIS — M6281 Muscle weakness (generalized): Secondary | ICD-10-CM | POA: Diagnosis not present

## 2017-09-07 DIAGNOSIS — M545 Low back pain: Secondary | ICD-10-CM | POA: Diagnosis not present

## 2017-09-07 DIAGNOSIS — R262 Difficulty in walking, not elsewhere classified: Secondary | ICD-10-CM | POA: Diagnosis not present

## 2017-09-07 DIAGNOSIS — M6281 Muscle weakness (generalized): Secondary | ICD-10-CM | POA: Diagnosis not present

## 2017-09-07 DIAGNOSIS — M25551 Pain in right hip: Secondary | ICD-10-CM | POA: Diagnosis not present

## 2017-09-14 DIAGNOSIS — M545 Low back pain: Secondary | ICD-10-CM | POA: Diagnosis not present

## 2017-09-14 DIAGNOSIS — M25551 Pain in right hip: Secondary | ICD-10-CM | POA: Diagnosis not present

## 2017-09-14 DIAGNOSIS — M6281 Muscle weakness (generalized): Secondary | ICD-10-CM | POA: Diagnosis not present

## 2017-09-14 DIAGNOSIS — R262 Difficulty in walking, not elsewhere classified: Secondary | ICD-10-CM | POA: Diagnosis not present

## 2017-09-26 DIAGNOSIS — M6281 Muscle weakness (generalized): Secondary | ICD-10-CM | POA: Diagnosis not present

## 2017-09-26 DIAGNOSIS — M545 Low back pain: Secondary | ICD-10-CM | POA: Diagnosis not present

## 2017-09-26 DIAGNOSIS — R262 Difficulty in walking, not elsewhere classified: Secondary | ICD-10-CM | POA: Diagnosis not present

## 2017-09-26 DIAGNOSIS — M25551 Pain in right hip: Secondary | ICD-10-CM | POA: Diagnosis not present

## 2017-10-03 DIAGNOSIS — M545 Low back pain: Secondary | ICD-10-CM | POA: Diagnosis not present

## 2017-10-03 DIAGNOSIS — M6281 Muscle weakness (generalized): Secondary | ICD-10-CM | POA: Diagnosis not present

## 2017-10-03 DIAGNOSIS — M25551 Pain in right hip: Secondary | ICD-10-CM | POA: Diagnosis not present

## 2017-10-03 DIAGNOSIS — R262 Difficulty in walking, not elsewhere classified: Secondary | ICD-10-CM | POA: Diagnosis not present

## 2017-11-13 DIAGNOSIS — Z6830 Body mass index (BMI) 30.0-30.9, adult: Secondary | ICD-10-CM | POA: Diagnosis not present

## 2017-11-13 DIAGNOSIS — M545 Low back pain: Secondary | ICD-10-CM | POA: Diagnosis not present

## 2017-11-13 DIAGNOSIS — R0609 Other forms of dyspnea: Secondary | ICD-10-CM | POA: Diagnosis not present

## 2017-11-13 DIAGNOSIS — F17210 Nicotine dependence, cigarettes, uncomplicated: Secondary | ICD-10-CM | POA: Diagnosis not present

## 2017-11-13 DIAGNOSIS — F3289 Other specified depressive episodes: Secondary | ICD-10-CM | POA: Diagnosis not present

## 2017-11-13 DIAGNOSIS — R5383 Other fatigue: Secondary | ICD-10-CM | POA: Diagnosis not present

## 2017-11-13 DIAGNOSIS — I10 Essential (primary) hypertension: Secondary | ICD-10-CM | POA: Diagnosis not present

## 2017-11-13 DIAGNOSIS — E668 Other obesity: Secondary | ICD-10-CM | POA: Diagnosis not present

## 2017-11-13 DIAGNOSIS — E1165 Type 2 diabetes mellitus with hyperglycemia: Secondary | ICD-10-CM | POA: Diagnosis not present

## 2020-03-07 ENCOUNTER — Encounter (HOSPITAL_COMMUNITY): Payer: Self-pay

## 2020-03-07 ENCOUNTER — Inpatient Hospital Stay (HOSPITAL_COMMUNITY)
Admission: EM | Admit: 2020-03-07 | Discharge: 2020-03-09 | DRG: 065 | Disposition: A | Discharge status: Home / Self Care | Payer: Medicare Other | Attending: Internal Medicine | Admitting: Internal Medicine

## 2020-03-07 ENCOUNTER — Other Ambulatory Visit: Payer: Self-pay

## 2020-03-07 ENCOUNTER — Emergency Department (HOSPITAL_COMMUNITY): Payer: Medicare Other

## 2020-03-07 DIAGNOSIS — Z7984 Long term (current) use of oral hypoglycemic drugs: Secondary | ICD-10-CM

## 2020-03-07 DIAGNOSIS — E1169 Type 2 diabetes mellitus with other specified complication: Secondary | ICD-10-CM | POA: Diagnosis present

## 2020-03-07 DIAGNOSIS — Z91040 Latex allergy status: Secondary | ICD-10-CM

## 2020-03-07 DIAGNOSIS — E118 Type 2 diabetes mellitus with unspecified complications: Secondary | ICD-10-CM | POA: Diagnosis present

## 2020-03-07 DIAGNOSIS — R29810 Facial weakness: Secondary | ICD-10-CM | POA: Diagnosis present

## 2020-03-07 DIAGNOSIS — Z888 Allergy status to other drugs, medicaments and biological substances status: Secondary | ICD-10-CM

## 2020-03-07 DIAGNOSIS — R471 Dysarthria and anarthria: Secondary | ICD-10-CM | POA: Diagnosis present

## 2020-03-07 DIAGNOSIS — E785 Hyperlipidemia, unspecified: Secondary | ICD-10-CM | POA: Diagnosis present

## 2020-03-07 DIAGNOSIS — I69351 Hemiplegia and hemiparesis following cerebral infarction affecting right dominant side: Secondary | ICD-10-CM

## 2020-03-07 DIAGNOSIS — I639 Cerebral infarction, unspecified: Principal | ICD-10-CM | POA: Diagnosis present

## 2020-03-07 DIAGNOSIS — E782 Mixed hyperlipidemia: Secondary | ICD-10-CM | POA: Diagnosis present

## 2020-03-07 DIAGNOSIS — R079 Chest pain, unspecified: Secondary | ICD-10-CM | POA: Diagnosis present

## 2020-03-07 DIAGNOSIS — F329 Major depressive disorder, single episode, unspecified: Secondary | ICD-10-CM | POA: Diagnosis present

## 2020-03-07 DIAGNOSIS — R270 Ataxia, unspecified: Secondary | ICD-10-CM | POA: Diagnosis present

## 2020-03-07 DIAGNOSIS — F419 Anxiety disorder, unspecified: Secondary | ICD-10-CM | POA: Diagnosis present

## 2020-03-07 DIAGNOSIS — F410 Panic disorder [episodic paroxysmal anxiety] without agoraphobia: Secondary | ICD-10-CM | POA: Diagnosis present

## 2020-03-07 DIAGNOSIS — Z87891 Personal history of nicotine dependence: Secondary | ICD-10-CM

## 2020-03-07 DIAGNOSIS — R29898 Other symptoms and signs involving the musculoskeletal system: Secondary | ICD-10-CM | POA: Diagnosis present

## 2020-03-07 DIAGNOSIS — E1165 Type 2 diabetes mellitus with hyperglycemia: Secondary | ICD-10-CM | POA: Diagnosis present

## 2020-03-07 DIAGNOSIS — R29705 NIHSS score 5: Secondary | ICD-10-CM | POA: Diagnosis present

## 2020-03-07 DIAGNOSIS — I10 Essential (primary) hypertension: Secondary | ICD-10-CM | POA: Diagnosis present

## 2020-03-07 DIAGNOSIS — R4701 Aphasia: Secondary | ICD-10-CM | POA: Diagnosis present

## 2020-03-07 DIAGNOSIS — Z79899 Other long term (current) drug therapy: Secondary | ICD-10-CM

## 2020-03-07 DIAGNOSIS — Z20822 Contact with and (suspected) exposure to covid-19: Secondary | ICD-10-CM | POA: Diagnosis present

## 2020-03-07 DIAGNOSIS — Z9049 Acquired absence of other specified parts of digestive tract: Secondary | ICD-10-CM

## 2020-03-07 DIAGNOSIS — Z882 Allergy status to sulfonamides status: Secondary | ICD-10-CM

## 2020-03-07 LAB — I-STAT BETA HCG BLOOD, ED (MC, WL, AP ONLY): I-stat hCG, quantitative: <5 m[IU]/mL (ref <5)

## 2020-03-07 LAB — BASIC METABOLIC PANEL
Anion gap: 13 (ref 5–15)
BUN: 18 mg/dL (ref 6–20)
CO2: 25 mmol/L (ref 22–32)
Calcium: 10.1 mg/dL (ref 8.9–10.3)
Chloride: 94 mmol/L — ABNORMAL LOW (ref 98–111)
Creatinine, Ser: 0.66 mg/dL (ref 0.44–1.00)
GFR calc Af Amer: >60 mL/min (ref >60)
GFR calc non Af Amer: >60 mL/min (ref >60)
Glucose, Bld: 199 mg/dL — ABNORMAL HIGH (ref 70–99)
Potassium: 4.1 mmol/L (ref 3.5–5.1)
Sodium: 132 mmol/L — ABNORMAL LOW (ref 135–145)

## 2020-03-07 LAB — CBC
HCT: 40.7 % (ref 36.0–46.0)
Hemoglobin: 14.4 g/dL (ref 12.0–15.0)
MCH: 31.4 pg (ref 26.0–34.0)
MCHC: 35.4 g/dL (ref 30.0–36.0)
MCV: 88.9 fL (ref 80.0–100.0)
Platelets: 230 10*3/uL (ref 150–400)
RBC: 4.58 MIL/uL (ref 3.87–5.11)
RDW: 12.4 % (ref 11.5–15.5)
WBC: 9.2 10*3/uL (ref 4.0–10.5)
nRBC: 0 % (ref 0.0–0.2)

## 2020-03-07 LAB — TROPONIN I (HIGH SENSITIVITY): Troponin I (High Sensitivity): 4 ng/L (ref <18)

## 2020-03-07 NOTE — ED Provider Notes (Signed)
[facility] DEPT Provider Note   CSN: [id number] Arrival date & time: 03/07/20  2137     History Chief Complaint  Patient presents with  . Stroke Symptoms  . Chest Pain    Peggy Ryan is a 59 y.o. female.  HPI     This is a 59 year old female with a history of diabetes who presents with concerns for stroke.  Patient reports that she has noted right facial droop, right hand weakness and "heaviness in my right foot."  She reports onset of symptoms Wednesday.  She states she last felt normal on Tuesday.  She did have some dental work on Tuesday and deep cleaning but does not believe that she had any facial droop at that time.  She states she has had some difficulty speaking and feels like her words are not coming out correctly.  She also reports difficulty texting and writing.  She is right-hand dominant.  She denies headache.  Denies recent fevers or illness.  No known sick contacts or Covid exposures.  She does report some chest pain which started this evening.  She states that it is burning and was postprandial.  There are no exertional symptoms.  No shortness of breath, leg swelling.  Past Medical History:  Diagnosis Date  . Anxiety   . Diabetes mellitus   . Panic attacks     There are no problems to display for this patient.   Past Surgical History:  Procedure Laterality Date  . CARPAL TUNNEL RELEASE    . CHOLECYSTECTOMY    . TONSILLECTOMY       OB History   No obstetric history on file.     No family history on file.  Social History   Tobacco Use  . Smoking status: Former Research scientist (life sciences)  . Smokeless tobacco: Former Systems developer    Quit date: 09/15/2008  Substance Use Topics  . Alcohol use: Not on file    Comment: socially  . Drug use: No    Home Medications Prior to Admission medications   Medication Sig Start Date End Date Taking? Authorizing Provider  buPROPion (WELLBUTRIN) 75 MG tablet Take 75 mg by mouth 2 (two) times daily.     [provider]  doxycycline (VIBRA-TABS) 100 MG tablet Take 100 mg by mouth 2 (two) times daily.    [provider]  fenofibrate (TRICOR) 145 MG tablet Take 145 mg by mouth daily.    [provider]  glimepiride (AMARYL) 1 MG tablet Take 1 mg by mouth daily before breakfast.    [provider]  hydrochlorothiazide (HYDRODIURIL) 25 MG tablet Take 25 mg by mouth daily.    [provider]  lisinopril (PRINIVIL,ZESTRIL) 10 MG tablet Take 10 mg by mouth daily.    [provider]  LORazepam (ATIVAN) 0.5 MG tablet Take 0.25-0.5 mg by mouth 4 (four) times daily.    [provider]  metFORMIN (GLUCOPHAGE) 500 MG tablet Take 500 mg by mouth 2 (two) times daily with a meal.    [provider]  tretinoin (RETIN-A) 0.05 % cream Apply 1 application topically at bedtime.    [provider]    Allergies    Levofloxacin, Latex, and Sulfa antibiotics  Review of Systems   Review of Systems  Constitutional: Negative for fever.  Respiratory: Negative for shortness of breath.   Cardiovascular: Positive for chest pain. Negative for leg swelling.  Gastrointestinal: Negative for abdominal pain, nausea and vomiting.  Genitourinary: Negative for dysuria.  Neurological: Positive for facial asymmetry, speech difficulty and weakness. Negative for dizziness and headaches.  All other systems reviewed and are negative.   Physical Exam Updated Vital Signs BP 140/68   Pulse 83   Temp 98.8 F (37.1 C) (Oral)   Resp (!) 9   Ht 1.549 m (5\' 1" )   Wt 70.3 kg   SpO2 96%   BMI 29.29 kg/m   Physical Exam Vitals and nursing note reviewed.  Constitutional:      Appearance: She is well-developed. She is obese.     Comments: ABCs intact  HENT:     Head: Normocephalic.  Eyes:     Pupils: Pupils are equal, round, and reactive to light.  Cardiovascular:     Rate and Rhythm: Normal rate and regular rhythm.     Heart sounds: Normal heart  sounds.  Pulmonary:     Effort: Pulmonary effort is normal. No respiratory distress.     Breath sounds: No wheezing.  Abdominal:     General: Bowel sounds are normal.     Palpations: Abdomen is soft.  Musculoskeletal:     Cervical back: Neck supple.     Right lower leg: No tenderness. No edema.     Left lower leg: No tenderness. No edema.  Skin:    General: Skin is warm and dry.  Neurological:     Mental Status: She is alert and oriented to person, place, and time.     Comments: Patient can name and repeat, receptive and expressive language appear intact, she does appear dysarthric, facial asymmetry noted but appears to be more of a droop on the left, 5 out of 5 strength in bilateral upper extremities with the exception of grip which is 4+ on the right and 5 on the left, no drift, 4 out of 5 strength right lower extremity proximal muscle, 5 out of 5 on left lower extremities, no dysmetria to finger-nose-finger  Psychiatric:        Mood and Affect: Mood normal.     ED Results / Procedures / Treatments   Labs (all labs ordered are listed, but only abnormal results are displayed) Labs Reviewed  BASIC METABOLIC PANEL - Abnormal; Notable for the following components:      Result Value   Sodium 132 (*)    Chloride 94 (*)    Glucose, Bld 199 (*)    All other components within normal limits  DIFFERENTIAL - Abnormal; Notable for the following components:   Lymphs Abs 4.3 (*)    All other components within normal limits  URINALYSIS, ROUTINE W REFLEX MICROSCOPIC - Abnormal; Notable for the following components:   Color, Urine STRAW (*)    All other components within normal limits  RESPIRATORY PANEL BY RT PCR (FLU A&B, COVID)  CBC  ETHANOL  PROTIME-INR  APTT  RAPID URINE DRUG SCREEN, HOSP PERFORMED  I-STAT BETA HCG BLOOD, ED ([facility], [facility], [facility] ONLY)  TROPONIN I (HIGH SENSITIVITY)  TROPONIN I (HIGH SENSITIVITY)    EKG EKG Interpretation  Date/Time:  Saturday March 07 2020 22:05:44  EDT Ventricular Rate:  98 PR Interval:    QRS Duration: 90 QT Interval:  358 QTC Calculation: 458 R Axis:   -42 Text Interpretation: Sinus rhythm Left axis deviation Low voltage, precordial leads Abnormal R-wave progression, early transition Borderline T wave abnormalities 12 Lead; Mason-Likar Confirmed by Thayer Jew [phone]) on 03/07/2020 10:57:19 PM   Radiology DG Chest 2 View  Result Date: 03/07/2020 CLINICAL DATA:  Chest pain.  Possible stroke 3 days ago. EXAM: CHEST - 2 VIEW COMPARISON:  None. FINDINGS: The heart size and mediastinal contours are within normal limits. Both lungs are clear. The visualized skeletal structures are unremarkable. IMPRESSION: No active cardiopulmonary disease. Electronically Signed   By: Lucienne Capers M.D.   On: 03/07/2020 22:43   CT Head Wo Contrast  Result Date: 03/08/2020 CLINICAL DATA:  Altered sensation of the right side of face, concern for stroke EXAM: CT HEAD WITHOUT CONTRAST TECHNIQUE: Contiguous axial images were obtained from the base of the skull through the vertex without intravenous contrast. COMPARISON:  None. FINDINGS: Brain: No evidence of acute infarction, hemorrhage, hydrocephalus, extra-axial collection, visible mass lesion or mass effect. Vascular: Atherosclerotic calcification of the carotid siphons and right intradural vertebral artery. No hyperdense vessel. Skull: No calvarial fracture or suspicious osseous lesion. No scalp swelling or hematoma. Sinuses/Orbits: Paranasal sinuses and mastoid air cells are predominantly clear. Included orbital structures are unremarkable. Other: None IMPRESSION: No acute intracranial abnormality. If there is persisting clinical concern for infarct, MRI is more sensitive and specific for early changes of ischemia. Intracranial atherosclerosis. Electronically Signed   By: Lovena Le M.D.   On: 03/08/2020 01:29    Procedures Procedures (including critical care time)  Medications Ordered in  ED Medications  aspirin chewable tablet 324 mg (has no administration in time range)    ED Course  I have reviewed the triage vital signs and the nursing notes.  Pertinent labs & imaging results that were available during my care of the patient were reviewed by me and considered in my medical decision making (see chart for details).    MDM Rules/Calculators/A&P                           Patient presents with weakness and facial droop as well as speech difficulty.  Symptoms onset greater than 72 hours ago.  On exam she has some dysarthria, aphasia, and some weakness.  She is otherwise nontoxic and her vital signs are reassuring.  Stroke work-up initiated.  CT scan obtained and does not show evidence of bleed or acute obvious ischemic etiology.  Lab work reviewed and largely unremarkable with exception of a sodium 132.  Patient did have some chest discomfort as well.  EKG without ischemic or arrhythmic changes.  Troponin x2 -.  Doubt ACS.  Patient was evaluated by teleneurology.  She has an NIH score of 5.  They recommend admission for full stroke work-up including MRI.  Recommend full dose aspirin at this time and 81 mg daily pending work-up.  Will discuss with minute hospitalist.  Final Clinical Impression(s) / ED Diagnoses Final diagnoses:  Cerebrovascular accident (CVA), unspecified mechanism [facility])    Rx / DC Orders ED Discharge Orders    None       Verenice Westrich, Barbette Hair, MD 03/08/20 0246

## 2020-03-07 NOTE — ED Triage Notes (Signed)
Arrived POV from home. Patient reports she thinks she had a stroke 3 days ago. Patient states the right side of her face does not feel right. Patient had dental work on Tuesday and she said dentist out "something up in the right side of my face".

## 2020-03-07 NOTE — ED Notes (Signed)
ED Provider at bedside. 

## 2020-03-07 NOTE — ED Notes (Signed)
Patient transported to X-ray 

## 2020-03-07 NOTE — ED Notes (Signed)
Patient ambulatory to and from the bathroom with a steady gait.  

## 2020-03-08 ENCOUNTER — Encounter (HOSPITAL_COMMUNITY): Payer: Self-pay | Admitting: Internal Medicine

## 2020-03-08 ENCOUNTER — Emergency Department (HOSPITAL_COMMUNITY): Payer: Medicare Other

## 2020-03-08 ENCOUNTER — Observation Stay (HOSPITAL_BASED_OUTPATIENT_CLINIC_OR_DEPARTMENT_OTHER): Payer: Medicare Other

## 2020-03-08 ENCOUNTER — Observation Stay (HOSPITAL_COMMUNITY): Payer: Medicare Other

## 2020-03-08 DIAGNOSIS — F329 Major depressive disorder, single episode, unspecified: Secondary | ICD-10-CM | POA: Diagnosis present

## 2020-03-08 DIAGNOSIS — R4701 Aphasia: Secondary | ICD-10-CM | POA: Diagnosis present

## 2020-03-08 DIAGNOSIS — I69351 Hemiplegia and hemiparesis following cerebral infarction affecting right dominant side: Secondary | ICD-10-CM | POA: Diagnosis not present

## 2020-03-08 DIAGNOSIS — E1169 Type 2 diabetes mellitus with other specified complication: Secondary | ICD-10-CM

## 2020-03-08 DIAGNOSIS — Z20822 Contact with and (suspected) exposure to covid-19: Secondary | ICD-10-CM | POA: Diagnosis present

## 2020-03-08 DIAGNOSIS — I639 Cerebral infarction, unspecified: Secondary | ICD-10-CM | POA: Diagnosis present

## 2020-03-08 DIAGNOSIS — Z91040 Latex allergy status: Secondary | ICD-10-CM | POA: Diagnosis not present

## 2020-03-08 DIAGNOSIS — R29705 NIHSS score 5: Secondary | ICD-10-CM | POA: Diagnosis present

## 2020-03-08 DIAGNOSIS — R079 Chest pain, unspecified: Secondary | ICD-10-CM | POA: Diagnosis present

## 2020-03-08 DIAGNOSIS — Z888 Allergy status to other drugs, medicaments and biological substances status: Secondary | ICD-10-CM | POA: Diagnosis not present

## 2020-03-08 DIAGNOSIS — E1165 Type 2 diabetes mellitus with hyperglycemia: Secondary | ICD-10-CM | POA: Diagnosis present

## 2020-03-08 DIAGNOSIS — E118 Type 2 diabetes mellitus with unspecified complications: Secondary | ICD-10-CM

## 2020-03-08 DIAGNOSIS — R29810 Facial weakness: Secondary | ICD-10-CM | POA: Diagnosis present

## 2020-03-08 DIAGNOSIS — R270 Ataxia, unspecified: Secondary | ICD-10-CM | POA: Diagnosis present

## 2020-03-08 DIAGNOSIS — E782 Mixed hyperlipidemia: Secondary | ICD-10-CM

## 2020-03-08 DIAGNOSIS — I10 Essential (primary) hypertension: Secondary | ICD-10-CM | POA: Diagnosis present

## 2020-03-08 DIAGNOSIS — R471 Dysarthria and anarthria: Secondary | ICD-10-CM | POA: Diagnosis present

## 2020-03-08 DIAGNOSIS — E785 Hyperlipidemia, unspecified: Secondary | ICD-10-CM | POA: Diagnosis present

## 2020-03-08 DIAGNOSIS — I6389 Other cerebral infarction: Secondary | ICD-10-CM | POA: Diagnosis not present

## 2020-03-08 DIAGNOSIS — F419 Anxiety disorder, unspecified: Secondary | ICD-10-CM | POA: Diagnosis present

## 2020-03-08 DIAGNOSIS — Z9049 Acquired absence of other specified parts of digestive tract: Secondary | ICD-10-CM | POA: Diagnosis not present

## 2020-03-08 DIAGNOSIS — R29898 Other symptoms and signs involving the musculoskeletal system: Secondary | ICD-10-CM

## 2020-03-08 DIAGNOSIS — Z882 Allergy status to sulfonamides status: Secondary | ICD-10-CM | POA: Diagnosis not present

## 2020-03-08 DIAGNOSIS — Z79899 Other long term (current) drug therapy: Secondary | ICD-10-CM | POA: Diagnosis not present

## 2020-03-08 DIAGNOSIS — F410 Panic disorder [episodic paroxysmal anxiety] without agoraphobia: Secondary | ICD-10-CM | POA: Diagnosis present

## 2020-03-08 DIAGNOSIS — Z7984 Long term (current) use of oral hypoglycemic drugs: Secondary | ICD-10-CM | POA: Diagnosis not present

## 2020-03-08 DIAGNOSIS — Z87891 Personal history of nicotine dependence: Secondary | ICD-10-CM | POA: Diagnosis not present

## 2020-03-08 LAB — PROTIME-INR
INR: 0.9 (ref 0.8–1.2)
Prothrombin Time: 11.9 seconds (ref 11.4–15.2)

## 2020-03-08 LAB — CBC
HCT: 39.2 % (ref 36.0–46.0)
Hemoglobin: 14.1 g/dL (ref 12.0–15.0)
MCH: 31.8 pg (ref 26.0–34.0)
MCHC: 36 g/dL (ref 30.0–36.0)
MCV: 88.5 fL (ref 80.0–100.0)
Platelets: 223 10*3/uL (ref 150–400)
RBC: 4.43 MIL/uL (ref 3.87–5.11)
RDW: 12.4 % (ref 11.5–15.5)
WBC: 9 10*3/uL (ref 4.0–10.5)
nRBC: 0 % (ref 0.0–0.2)

## 2020-03-08 LAB — COMPREHENSIVE METABOLIC PANEL
ALT: 42 U/L (ref 0–44)
AST: 25 U/L (ref 15–41)
Albumin: 4.2 g/dL (ref 3.5–5.0)
Alkaline Phosphatase: 40 U/L (ref 38–126)
Anion gap: 12 (ref 5–15)
BUN: 14 mg/dL (ref 6–20)
CO2: 25 mmol/L (ref 22–32)
Calcium: 9.5 mg/dL (ref 8.9–10.3)
Chloride: 97 mmol/L — ABNORMAL LOW (ref 98–111)
Creatinine, Ser: 0.55 mg/dL (ref 0.44–1.00)
GFR calc Af Amer: >60 mL/min (ref >60)
GFR calc non Af Amer: >60 mL/min (ref >60)
Glucose, Bld: 209 mg/dL — ABNORMAL HIGH (ref 70–99)
Potassium: 3.8 mmol/L (ref 3.5–5.1)
Sodium: 134 mmol/L — ABNORMAL LOW (ref 135–145)
Total Bilirubin: 0.6 mg/dL (ref 0.3–1.2)
Total Protein: 7.1 g/dL (ref 6.5–8.1)

## 2020-03-08 LAB — DIFFERENTIAL
Abs Immature Granulocytes: 0.02 10*3/uL (ref 0.00–0.07)
Basophils Absolute: 0.1 10*3/uL (ref 0.0–0.1)
Basophils Relative: 1 %
Eosinophils Absolute: 0.2 10*3/uL (ref 0.0–0.5)
Eosinophils Relative: 2 %
Immature Granulocytes: 0 %
Lymphocytes Relative: 44 %
Lymphs Abs: 4.3 10*3/uL — ABNORMAL HIGH (ref 0.7–4.0)
Monocytes Absolute: 0.5 10*3/uL (ref 0.1–1.0)
Monocytes Relative: 5 %
Neutro Abs: 4.7 10*3/uL (ref 1.7–7.7)
Neutrophils Relative %: 48 %

## 2020-03-08 LAB — URINALYSIS, ROUTINE W REFLEX MICROSCOPIC
Bilirubin Urine: NEGATIVE
Glucose, UA: NEGATIVE mg/dL
Hgb urine dipstick: NEGATIVE
Ketones, ur: NEGATIVE mg/dL
Leukocytes,Ua: NEGATIVE
Nitrite: NEGATIVE
Protein, ur: NEGATIVE mg/dL
Specific Gravity, Urine: 1.013 (ref 1.005–1.030)
pH: 6 (ref 5.0–8.0)

## 2020-03-08 LAB — LIPID PANEL
Cholesterol: 325 mg/dL — ABNORMAL HIGH (ref 0–200)
HDL: 30 mg/dL — ABNORMAL LOW (ref >40)
LDL Cholesterol: UNDETERMINED mg/dL (ref 0–99)
Total CHOL/HDL Ratio: 10.8 RATIO
Triglycerides: 665 mg/dL — ABNORMAL HIGH (ref <150)
VLDL: UNDETERMINED mg/dL (ref 0–40)

## 2020-03-08 LAB — RESPIRATORY PANEL BY RT PCR (FLU A&B, COVID)
Influenza A by PCR: NEGATIVE
Influenza B by PCR: NEGATIVE
SARS Coronavirus 2 by RT PCR: NEGATIVE

## 2020-03-08 LAB — TROPONIN I (HIGH SENSITIVITY): Troponin I (High Sensitivity): 4 ng/L (ref <18)

## 2020-03-08 LAB — ECHOCARDIOGRAM COMPLETE BUBBLE STUDY
Area-P 1/2: 2.66 cm2
Calc EF: 68.1 %
S' Lateral: 2.2 cm
Single Plane A2C EF: 73.9 %
Single Plane A4C EF: 60.8 %

## 2020-03-08 LAB — CBG MONITORING, ED
Glucose-Capillary: 174 mg/dL — ABNORMAL HIGH (ref 70–99)
Glucose-Capillary: 198 mg/dL — ABNORMAL HIGH (ref 70–99)
Glucose-Capillary: 227 mg/dL — ABNORMAL HIGH (ref 70–99)
Glucose-Capillary: 310 mg/dL — ABNORMAL HIGH (ref 70–99)

## 2020-03-08 LAB — RAPID URINE DRUG SCREEN, HOSP PERFORMED
Amphetamines: NOT DETECTED
Barbiturates: NOT DETECTED
Benzodiazepines: NOT DETECTED
Cocaine: NOT DETECTED
Opiates: NOT DETECTED
Tetrahydrocannabinol: NOT DETECTED

## 2020-03-08 LAB — APTT: aPTT: 27 seconds (ref 24–36)

## 2020-03-08 LAB — HIV ANTIBODY (ROUTINE TESTING W REFLEX): HIV Screen 4th Generation wRfx: NONREACTIVE

## 2020-03-08 LAB — LDL CHOLESTEROL, DIRECT: Direct LDL: 160.4 mg/dL — ABNORMAL HIGH (ref 0–99)

## 2020-03-08 LAB — HEMOGLOBIN A1C
Hgb A1c MFr Bld: 8.1 % — ABNORMAL HIGH (ref 4.8–5.6)
Mean Plasma Glucose: 185.77 mg/dL

## 2020-03-08 LAB — ETHANOL: Alcohol, Ethyl (B): <10 mg/dL (ref <10)

## 2020-03-08 MED ORDER — FENOFIBRATE 160 MG PO TABS
160.0000 mg | ORAL_TABLET | Freq: Every day | ORAL | Status: DC
  Administered 2020-03-08 – 2020-03-09 (×2): 160 mg via ORAL
  Filled 2020-03-08 (×2): qty 1

## 2020-03-08 MED ORDER — INSULIN ASPART 100 UNIT/ML ~~LOC~~ SOLN
0.0000 [IU] | Freq: Four times a day (QID) | SUBCUTANEOUS | Status: DC
  Administered 2020-03-08: 11 [IU] via SUBCUTANEOUS
  Administered 2020-03-08: 5 [IU] via SUBCUTANEOUS
  Administered 2020-03-08: 3 [IU] via SUBCUTANEOUS
  Filled 2020-03-08: qty 0.15

## 2020-03-08 MED ORDER — BUPROPION HCL 75 MG PO TABS
75.0000 mg | ORAL_TABLET | Freq: Every day | ORAL | Status: DC
  Administered 2020-03-08 – 2020-03-09 (×2): 75 mg via ORAL
  Filled 2020-03-08 (×2): qty 1

## 2020-03-08 MED ORDER — LORAZEPAM 0.5 MG PO TABS
0.5000 mg | ORAL_TABLET | Freq: Four times a day (QID) | ORAL | Status: DC
  Administered 2020-03-08 – 2020-03-09 (×6): 0.5 mg via ORAL
  Filled 2020-03-08 (×6): qty 1

## 2020-03-08 MED ORDER — ACETAMINOPHEN 325 MG PO TABS: 650.0000 mg | ORAL_TABLET | ORAL | Status: DC | PRN

## 2020-03-08 MED ORDER — INSULIN ASPART 100 UNIT/ML ~~LOC~~ SOLN
4.0000 [IU] | Freq: Three times a day (TID) | SUBCUTANEOUS | Status: DC
  Administered 2020-03-09 (×3): 4 [IU] via SUBCUTANEOUS
  Filled 2020-03-08: qty 0.04

## 2020-03-08 MED ORDER — ACETAMINOPHEN 160 MG/5ML PO SOLN: 650.0000 mg | ORAL | Status: DC | PRN

## 2020-03-08 MED ORDER — LORAZEPAM 0.5 MG PO TABS
0.2500 mg | ORAL_TABLET | Freq: Four times a day (QID) | ORAL | Status: DC
  Administered 2020-03-08: 0.5 mg via ORAL
  Filled 2020-03-08: qty 1

## 2020-03-08 MED ORDER — NICOTINE 14 MG/24HR TD PT24
14.0000 mg | MEDICATED_PATCH | Freq: Every day | TRANSDERMAL | Status: DC
  Administered 2020-03-08 – 2020-03-09 (×2): 14 mg via TRANSDERMAL
  Filled 2020-03-08 (×2): qty 1

## 2020-03-08 MED ORDER — ACETAMINOPHEN 650 MG RE SUPP: 650.0000 mg | RECTAL | Status: DC | PRN

## 2020-03-08 MED ORDER — HYDRALAZINE HCL 20 MG/ML IJ SOLN: 10.0000 mg | Freq: Four times a day (QID) | INTRAMUSCULAR | Status: DC | PRN

## 2020-03-08 MED ORDER — ATORVASTATIN CALCIUM 40 MG PO TABS: 80.0000 mg | ORAL_TABLET | Freq: Every day | ORAL | Status: DC

## 2020-03-08 MED ORDER — ASPIRIN 81 MG PO CHEW
324.0000 mg | CHEWABLE_TABLET | Freq: Once | ORAL | Status: AC
  Administered 2020-03-08: 324 mg via ORAL
  Filled 2020-03-08: qty 4

## 2020-03-08 MED ORDER — STROKE: EARLY STAGES OF RECOVERY BOOK
Freq: Once | Status: DC
  Filled 2020-03-08: qty 1

## 2020-03-08 MED ORDER — SODIUM CHLORIDE 0.9 % IV SOLN: INTRAVENOUS | Status: DC

## 2020-03-08 MED ORDER — INSULIN ASPART 100 UNIT/ML ~~LOC~~ SOLN
0.0000 [IU] | Freq: Every day | SUBCUTANEOUS | Status: DC
  Filled 2020-03-08: qty 0.05

## 2020-03-08 MED ORDER — ASPIRIN 81 MG PO CHEW
81.0000 mg | CHEWABLE_TABLET | Freq: Every day | ORAL | Status: DC
  Administered 2020-03-08 – 2020-03-09 (×2): 81 mg via ORAL
  Filled 2020-03-08 (×2): qty 1

## 2020-03-08 MED ORDER — POLYETHYLENE GLYCOL 3350 17 G PO PACK: 17.0000 g | PACK | Freq: Every day | ORAL | Status: DC | PRN

## 2020-03-08 MED ORDER — INSULIN ASPART 100 UNIT/ML ~~LOC~~ SOLN
0.0000 [IU] | Freq: Three times a day (TID) | SUBCUTANEOUS | Status: DC
  Administered 2020-03-09 (×2): 5 [IU] via SUBCUTANEOUS
  Filled 2020-03-08: qty 0.15

## 2020-03-08 MED ORDER — ONDANSETRON HCL 4 MG/2ML IJ SOLN: 4.0000 mg | Freq: Four times a day (QID) | INTRAMUSCULAR | Status: DC | PRN

## 2020-03-08 MED ORDER — CITALOPRAM HYDROBROMIDE 20 MG PO TABS
40.0000 mg | ORAL_TABLET | Freq: Every day | ORAL | Status: DC
  Administered 2020-03-08 – 2020-03-09 (×2): 40 mg via ORAL
  Filled 2020-03-08 (×2): qty 4

## 2020-03-08 MED ORDER — ROSUVASTATIN CALCIUM 20 MG PO TABS
40.0000 mg | ORAL_TABLET | Freq: Every day | ORAL | Status: DC
  Administered 2020-03-08 – 2020-03-09 (×2): 40 mg via ORAL
  Filled 2020-03-08 (×2): qty 2

## 2020-03-08 NOTE — ED Notes (Signed)
Patient transported to MRI 

## 2020-03-08 NOTE — ED Notes (Signed)
Assisted with tele neuro exam.

## 2020-03-08 NOTE — ED Notes (Signed)
Per CNGF@ [facility]acement unable to provide ETA for transfer. [facility] MRI called for procedure- no answer. Will re-attempt contact. Alaina RN advised. [employer]

## 2020-03-08 NOTE — Evaluation (Signed)
Speech Language Pathology Evaluation Patient Details Name: Peggy Ryan MRN: [id number] DOB: Nov 29, 1960 Today's Date: 03/08/2020 Time: 1700-1720 SLP Time Calculation (min) (ACUTE ONLY): 20 min  Problem List:  Patient Active Problem List   Diagnosis Date Noted  . Weakness of right upper extremity 03/08/2020  . Dysarthria 03/08/2020  . Essential hypertension 03/08/2020  . Major depressive disorder 03/08/2020  . Panic disorder 03/08/2020  . Type 2 diabetes mellitus with complication, without long-term current use of insulin ([facility]) 03/08/2020  . Mixed diabetic hyperlipidemia associated with type 2 diabetes mellitus ([facility]) 03/08/2020  . CVA (cerebral vascular accident) ([facility]) 03/08/2020   Past Medical History:  Past Medical History:  Diagnosis Date  . Anxiety   . Diabetes mellitus   . Panic attacks    Past Surgical History:  Past Surgical History:  Procedure Laterality Date  . CARPAL TUNNEL RELEASE    . CHOLECYSTECTOMY    . TONSILLECTOMY     HPI:  Patient is a 59 y.o. female with PMH: DM-2, HTN, panic disorder, major depressive disorder, nicotine dependence who presented to [facility] ED with multiple neurologica complaints. MRI revealed Acute/early subacute left pontine infarct with mild associated edema.   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 She would laugh/chuckle when talking about her symptoms, would talk rapidly at times and appeared overall to be disorganized in her thought pattern and actions. Patient described symptoms of speech not being clear, coughing when eating/drinking, unable to write or use phone to text and legs feeling "heavy". She also endorses h/o restless leg syndrome. SLP did observe patient to cough during PO intake, however patient also was very impulsive  with eating and cough sounded raspy and SLP questions dysphgia vs GERD symptoms. Speech was intelligible 100% at conversational level and only evidence of oral motor impairment was mild right facial assymetry with corner of her lips. Patient was oriented x4 but SLP had concerns regarding her impulsivity and abnormal affect. SLP also questions patient's accuracy when describing her symptoms as she seems to be exaggerating them somewhat versus being hyper aware and anxious.    SLP Assessment  SLP Recommendation/Assessment: Patient needs continued Speech Lanaguage Pathology Services SLP Visit Diagnosis: Cognitive communication deficit (R41.841)    Follow Up Recommendations  Other (comment) (TBD, likely none)    Frequency and Duration min 1 x/week         SLP Evaluation Cognition  Overall Cognitive Status: Difficult to assess Arousal/Alertness: Awake/alert Orientation Level: Oriented X4 Attention: Alternating Alternating Attention: Impaired Alternating Attention Impairment: Verbal basic;Verbal complex Memory: Appears intact Awareness: Impaired Awareness Impairment: Emergent impairment;Anticipatory impairment Problem Solving: Appears intact Executive Function: Sequencing Sequencing: Impaired Sequencing Impairment: Functional basic Behaviors: Impulsive;Restless Safety/Judgment: Impaired       Comprehension  Auditory Comprehension Overall Auditory Comprehension: Appears within functional limits for tasks assessed    Expression Expression Primary Mode of Expression: Verbal Verbal Expression Overall Verbal Expression: Appears within functional limits for tasks assessed   Oral / Motor  Oral Motor/Sensory Function Overall Oral Motor/Sensory Function: Mild impairment Facial ROM: Reduced right Facial Symmetry: Abnormal symmetry right Facial Strength: Within Functional Limits Lingual ROM: Within Functional Limits Lingual Symmetry: Within Functional Limits Lingual Strength: Within  Functional Limits Velum: Within Functional Limits Mandible: Within Functional Limits   GO  Sonia Baller, MA, CCC-SLP Speech Therapy

## 2020-03-08 NOTE — ED Notes (Signed)
Patient is demanding to leave AMA. MD paged to make aware. Patient is stating she is leaving and that she is not going to [facility]. MD called and states he will come down and talk to patient. RN attempted to explain the severity of leaving post stroke without proper care/follow up/medications. Patient reports she is leaving and wants her IV taken out but reports she has no ride at this time. RN has also tried to explain to patient that she is at high risk for death and other serious complications but patient is adamant on leaving. MD states he will come and talk to patient soon.

## 2020-03-08 NOTE — ED Notes (Signed)
Dinner tray delivered.

## 2020-03-08 NOTE — Progress Notes (Signed)
  Echocardiogram 2D Echocardiogram has been performed.  Peggy Ryan 03/08/2020, 9:24 AM

## 2020-03-08 NOTE — Consult Note (Signed)
[facility] TeleNeurology Consult Services  Stat Consult  Date of Service:   03/08/2020 01:44:37  Impression:     .  R47.01 - Aphasia     .  R20.2 - Paresthesia of skin  Comments/Sign-Out: 59 year old woman reports 3 days of right sided numbness, impaired coordination and speech difficulty. Head CT negative per radiology, but there does appear to be a hypo-density within the left pons, but this may be more of an artifact. I would not expect a pontine stroke to cause aphasia.  CT HEAD: Showed No Acute Hemorrhage or Acute Core Infarct Reviewed On my review, questionable hyper density left pons versus artifact.  Metrics: [facility] Notification Time: 03/08/2020 01:41:59 Stamp Time: 03/08/2020 01:44:37 Callback Response Time: 03/08/2020 01:45:51  Our recommendations are outlined below.  Recommendations:     .  Initiate stroke admission order set     .  admit to stroke/telemetry floor     .  head of bed 30     .  aspiration precautions     .  NPO until swallow evaluation     .  neuro- checks     .  Initiate aspirin 325 mg no followed by 81 mg daily beginning tomorrow.     .  She reports previous statin intolerance     .  DVT prophylaxis per primary team.  Smoking cessation   Imaging Studies:     .  MRI Head     .  MRA Head and Neck Without Contrast When Available - Stroke Protocol     .  Carotid Dopplers     .  Echocardiogram - Transthoracic Echocardiogram  Therapies:     .  Physical Therapy, Occupational Therapy, Speech Therapy Assessment When Applicable  Disposition: Neurology Follow Up Recommended  Sign Out:     .  Discussed with Emergency Department Provider  ----------------------------------------------------------------------------------------------------  Chief Complaint: Right sided weakness, heaviness, speech changes  History of Present Illness: Patient is a 59 year old Female.  58 year old right handed woman with history of  hypertension, diabetes reports three days of right sided weakness, heaviness and voice changes. She tells me that she was taking a shower and she noticed she was very off balance. She thought it was due to a dental procedure she'd had earlier or due to some sinus congestion. The next day she noticed she "wasn't talking right". She says her speech is slurred and it's hard to come up with words at times. She's been saying "crazy words". She's also noticed her right hand doesn't seem to working. She denies any visual changes.   SocHx: Ongoing tobacco. One PPD for many years. No etOH   Past Medical History:     . Hypertension     . Diabetes Mellitus     . Hyperlipidemia  Anticoagulant use:  No  Antiplatelet use: No    Examination: BP(140/66), Pulse(89), Blood Glucose(199) 1A: Level of Consciousness - Alert; keenly responsive + 0 1B: Ask Month and Age - 1 Question Right + 1 1C: Blink Eyes & Squeeze Hands - Performs Both Tasks + 0 2: Test Horizontal Extraocular Movements - Normal + 0 3: Test Visual Fields - No Visual Loss + 0 4: Test Facial Palsy (Use Grimace if Obtunded) - Normal symmetry + 0 5A: Test Left Arm Motor Drift - No Drift for 10 Seconds + 0 5B: Test Right Arm Motor Drift - No Drift for 10 Seconds + 0 6A: Test Left Leg Motor Drift - No  Drift for 5 Seconds + 0 6B: Test Right Leg Motor Drift - No Drift for 5 Seconds + 0 7: Test Limb Ataxia (FNF/Heel-Shin) - Ataxia in 1 Limb + 1 8: Test Sensation - Mild-Moderate Loss: Less Sharp/More Dull + 1 9: Test Language/Aphasia - Mild-Moderate Aphasia: Some Obvious Changes, Without Significant Limitation + 1 10: Test Dysarthria - Mild-Moderate Dysarthria: Slurring but can be understood + 1 11: Test Extinction/Inattention - No abnormality + 0  NIHSS Score: 5   Patient/Family was informed the Neurology Consult would occur via TeleHealth consult by way of interactive audio and video telecommunications and consented to receiving care in this  manner.  Patient is being evaluated for possible acute neurologic impairment and high probability of imminent or life-threatening deterioration. I spent total of 25 minutes providing care to this patient, including time for face to face visit via telemedicine, review of medical records, imaging studies and discussion of findings with providers, the patient and/or family.   Dr Alonna Buckler   [facility] [phone]  Case [id number]

## 2020-03-08 NOTE — ED Notes (Signed)
ECHO at bedside.

## 2020-03-08 NOTE — CV Procedure (Signed)
2D echo attempted, RN in room. Will try later

## 2020-03-08 NOTE — Progress Notes (Signed)
[facility] PROGRESS NOTE  Patient: Peggy Ryan [id number]:[id number]   PCP: Shon Baton, MD DOB: 07-Sep-1960   DOA: 03/07/2020   DOS: 03/08/2020    Subjective: Despite multiple attempts to explain the reasoning discontinued by nursing staff patient demanded to leave the hospital AMA with and I was called. At the time of my evaluation patient had questions why she needs to stay in the hospital.  She also had concerns regarding being transferred at [facility] and the reasoning behind that.  She also was requesting a diet. She denies having any complaints of dizziness or lightheadedness.  No nausea no vomiting.  No chest pain no abdominal pain.  No fever no chills.  Objective:  Vitals:   03/08/20 1751 03/08/20 1808  BP:  110/76  Pulse: 88 88  Resp:  16  Temp:  98.2 F (36.8 C)  SpO2: 97% 96%    Clear to auscultation. S1-S2 present. Right upper extremity generalized weakness. Mild limb ataxia on ambulation but still patient was able to ambulate without any assistance. Romberg sign positive. Finger-nose-finger difficult to perform bilaterally right more than left. No pronator drift. No asterixis.  Assessment and plan: Acute CVA.  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 Still symptomatic from it. Explained to patient that she will still benefit from being evaluated by neurologist. Based on the findings of vertebral artery occlusion she may also benefit from interventional radiology consultation should neurologist believes that will be beneficial. Patient has severe ataxia and will benefit from aggressive PT and OT may even require CIR. Patient has already passed a swallow eval therefore will be able to eat. After understanding all this necessary reason to stay in the hospital, patient is going to stay and not leave AMA.  Threatening to leave AMA. As above patient currently is willing to stay and complete the stroke work-up and looking forward to neurology  consultation.  Type 2 diabetes mellitus uncontrolled with hyperglycemia with hyperlipidemia. Blood sugars elevated. Sliding scale insulin. Carb modified diet.  Severe hypertriglyceridemia along with hyperlipidemia. Platelets are significantly elevated although patient says it generally stays in thousands. Cholesterol and LDL also elevated.  Patient tells me that she has problems with her lipid panel since she was in 75s. She also mentions that she has tried all types of steroids but they generally "zap her out of energy". Currently willing to try Crestor. Explained that she may also be a candidate for Repatha injections if this does not work.  Chest pain-noncardiac. Troponins are negative. Echocardiogram shows preserved EF. Explained to patient that she does not appear to be suffering from heart attack.   Author: Berle Mull, MD [facility] 03/08/2020 6:16 PM   If 7PM-7AM, please contact night-coverage at [email or web address]

## 2020-03-08 NOTE — ED Notes (Signed)
MD at bedside at this time.

## 2020-03-08 NOTE — ED Notes (Signed)
Patient is resting comfortably. 

## 2020-03-08 NOTE — H&P (Signed)
History and Physical    Peggy Ryan NKN:[id number] DOB: 06-29-1960 DOA: 03/07/2020  PCP: Shon Baton, MD  Patient coming from: Home   Chief Complaint:  Chief Complaint  Patient presents with  . Stroke Symptoms  . Chest Pain     HPI:   59 year old female with past medical history of diabetes mellitus type 2, hypertension, panic disorder, major depressive disorder, nicotine dependence who presents to [facility] emergency department with multiple neurologic complaints.   Patient explains that on Wednesday morning she woke from sleep and while walking to the bathroom noticed that she had difficulty with her balance.  In the next several hours the patient noticed that she was slurring her words.  She also noticed that her right upper extremity was weak and she had difficulty using her right hand to the point where she could no longer text on her phone or write.    Patient denies any associated chest pain, shortness of breath, palpitations.  Patient denies any sensory changes.  Patient denies any visual changes.   Patient symptoms persisted for over 3 days until she eventually decided to present to [facility] emergency department for evaluation.  Upon evaluation in the emergency department due to patient's observed neurologic symptoms CT imaging of the head was performed and was found to be negative.  Overnight telemetry neurologist did evaluate the patient and recommended hospitalization for stroke work-up.  Case was then discussed with Dr. Cheral Marker with neurology who recommended transfer to [facility] for continued work-up.  Patient was administered 325 mg of aspirin.  The hospitalist group was then called to assess the patient for admission to the hospital.  Review of Systems:   Review of Systems  Neurological: Positive for speech change and focal weakness.  All other systems reviewed and are negative.   Past Medical History:  Diagnosis Date  . Anxiety   .  Diabetes mellitus   . Panic attacks     Past Surgical History:  Procedure Laterality Date  . CARPAL TUNNEL RELEASE    . CHOLECYSTECTOMY    . TONSILLECTOMY       reports that she has quit smoking. She quit smokeless tobacco use about 11 years ago. She reports that she does not use drugs. No history on file for alcohol use.  Allergies  Allergen Reactions  . Levofloxacin Anaphylaxis  . Latex Rash  . Sulfa Antibiotics Rash    Family History  Problem Relation Age of Onset  . Heart disease Father      Prior to Admission medications   Medication Sig Start Date End Date Taking? Authorizing Provider  buPROPion (WELLBUTRIN) 75 MG tablet Take 75 mg by mouth daily.    Yes [provider]  citalopram (CELEXA) 40 MG tablet Take 40 mg by mouth daily. 12/25/19  Yes [provider]  fenofibrate (TRICOR) 145 MG tablet Take 145 mg by mouth daily.   Yes [provider]  glimepiride (AMARYL) 1 MG tablet Take 2 mg by mouth in the morning and at bedtime.    Yes [provider]  hydrochlorothiazide (HYDRODIURIL) 25 MG tablet Take 12.5 mg by mouth daily.    Yes [provider]  lisinopril (PRINIVIL,ZESTRIL) 10 MG tablet Take 10 mg by mouth daily.   Yes [provider]  LORazepam (ATIVAN) 0.5 MG tablet Take 0.25-0.5 mg by mouth 4 (four) times daily.   Yes [provider]  metFORMIN (GLUCOPHAGE) 500 MG tablet Take 500 mg by mouth 2 (two)  times daily with a meal.   Yes [provider]    Physical Exam: Vitals:   03/08/20 0330 03/08/20 0405 03/08/20 0500 03/08/20 0530  BP: 140/76 (!) 152/67 137/73 (!) 120/56  Pulse: 81 84 78 75  Resp: 12 18 (!) 8 10  Temp:      TempSrc:      SpO2: 95% 97% 96% 95%  Weight:      Height:        Constitutional: Acute alert and oriented x3, no associated distress.   Skin: no rashes, no lesions, good skin turgor noted. Eyes: Pupils are equally reactive to light.  No evidence of scleral icterus  or conjunctival pallor.  ENMT: Moist mucous membranes noted.  Posterior pharynx clear of any exudate or lesions.   Neck: normal, supple, no masses, no thyromegaly.  No evidence of jugular venous distension.   Respiratory: clear to auscultation bilaterally, no wheezing, no crackles. Normal respiratory effort. No accessory muscle use.  Cardiovascular: Regular rate and rhythm, no murmurs / rubs / gallops. No extremity edema. 2+ pedal pulses. No carotid bruits.  Chest:   Nontender without crepitus or deformity.   Back:   Nontender without crepitus or deformity. Abdomen: Abdomen is soft and nontender.  No evidence of intra-abdominal masses.  Positive bowel sounds noted in all quadrants.   Musculoskeletal: No joint deformity upper and lower extremities. Good ROM, no contractures. Normal muscle tone.  Neurologic: Notable facial droop, otherwise remainder of cranial nerves are intact.  Notable right upper extremity weakness in both proximal and distal muscle groups.  Patient is following all commands.  Patient is awake alert and oriented x3.  Psychiatric: Patient exhibits normal mood with appropriate affect.  Patient seems to possess insight as to their current situation.     Labs on Admission: I have personally reviewed following labs and imaging studies -   CBC: Recent Labs  Lab 03/07/20 2230 03/07/20 2357  WBC 9.2  --   NEUTROABS  --  4.7  HGB 14.4  --   HCT 40.7  --   MCV 88.9  --   PLT 230  --    Basic Metabolic Panel: Recent Labs  Lab 03/07/20 2230  NA 132*  K 4.1  CL 94*  CO2 25  GLUCOSE 199*  BUN 18  CREATININE 0.66  CALCIUM 10.1   GFR: Estimated Creatinine Clearance: 68.7 mL/min (by C-G formula based on SCr of 0.66 mg/dL). Liver Function Tests: No results for input(s): AST, ALT, ALKPHOS, BILITOT, PROT, ALBUMIN in the last 168 hours. No results for input(s): LIPASE, AMYLASE in the last 168 hours. No results for input(s): AMMONIA in the last 168 hours. Coagulation  Profile: Recent Labs  Lab 03/07/20 2357  INR 0.9   Cardiac Enzymes: No results for input(s): CKTOTAL, CKMB, CKMBINDEX, TROPONINI in the last 168 hours. BNP (last 3 results) No results for input(s): PROBNP in the last 8760 hours. HbA1C: No results for input(s): HGBA1C in the last 72 hours. CBG: No results for input(s): GLUCAP in the last 168 hours. Lipid Profile: No results for input(s): CHOL, HDL, LDLCALC, TRIG, CHOLHDL, LDLDIRECT in the last 72 hours. Thyroid Function Tests: No results for input(s): TSH, T4TOTAL, FREET4, T3FREE, THYROIDAB in the last 72 hours. Anemia Panel: No results for input(s): VITAMINB12, FOLATE, FERRITIN, TIBC, IRON, RETICCTPCT in the last 72 hours. Urine analysis:    Component Value Date/Time   COLORURINE STRAW (A) 03/07/2020 2357   APPEARANCEUR CLEAR 03/07/2020 2357   LABSPEC 1.013 03/07/2020  [place] 6.0 03/07/2020 2357   [place] 03/07/2020 2357   [place] NEGATIVE 03/07/2020 2357   [place] 03/07/2020 2357   [place] 03/07/2020 2357   PROTEINUR NEGATIVE 03/07/2020 2357   NITRITE NEGATIVE 03/07/2020 2357   LEUKOCYTESUR NEGATIVE 03/07/2020 2357    Radiological Exams on Admission - Personally Reviewed: DG Chest 2 View  Result Date: 03/07/2020 CLINICAL DATA:  Chest pain.  Possible stroke 3 days ago. EXAM: CHEST - 2 VIEW COMPARISON:  None. FINDINGS: The heart size and mediastinal contours are within normal limits. Both lungs are clear. The visualized skeletal structures are unremarkable. IMPRESSION: No active cardiopulmonary disease. Electronically Signed   By: Lucienne Capers M.D.   On: 03/07/2020 22:43   CT Head Wo Contrast  Result Date: 03/08/2020 CLINICAL DATA:  Altered sensation of the right side of face, concern for stroke EXAM: CT HEAD WITHOUT CONTRAST TECHNIQUE: Contiguous axial images were obtained from the base of the skull through the vertex without intravenous contrast. COMPARISON:  None. FINDINGS:  Brain: No evidence of acute infarction, hemorrhage, hydrocephalus, extra-axial collection, visible mass lesion or mass effect. Vascular: Atherosclerotic calcification of the carotid siphons and right intradural vertebral artery. No hyperdense vessel. Skull: No calvarial fracture or suspicious osseous lesion. No scalp swelling or hematoma. Sinuses/Orbits: Paranasal sinuses and mastoid air cells are predominantly clear. Included orbital structures are unremarkable. Other: None IMPRESSION: No acute intracranial abnormality. If there is persisting clinical concern for infarct, MRI is more sensitive and specific for early changes of ischemia. Intracranial atherosclerosis. Electronically Signed   By: Lovena Le M.D.   On: 03/08/2020 01:29    EKG: Personally reviewed.  Rhythm is normal sinus rhythm with heart rate of 90 bpm.  No dynamic ST segment changes appreciated.  Assessment/Plan Principal Problem:   Dysarthria   Patient presents with several day history of multiple neurologic symptoms including right upper extremity weakness, difficulty with balance as well as dysarthria.  While CT imaging of the head without contrast reveals no evidence of stroke, clinically the suspicion for this is extremely high  Case is already been evaluated by both the teleneurologist and discussed with Dr. Cheral Marker.  Both providers recommend hospitalization for continued stroke work-up.  Dr. Cheral Marker recommends sending patient to [facility] to complete this work-up.  MRI brain without contrast ordered  MRA of head and neck without contrast ordered  Echocardiogram ordered  Patient currently n.p.o., will then proceed with swallow screen and speech therapy evaluation  Physical therapy evaluation additionally ordered  Permissive hypertension  Patient placed on daily antiplatelet therapy.  325 mg x 1 followed by 81 mg daily was recommended.  Patient is declining statin therapy stating that she is allergic to "all of  them."  We will continue her home regimen of fenofibrate and, defer initiation of additional lipid-lowering agents to neurology.  Active Problems:   Weakness of right upper extremity   Please see assessment and plan above    Essential hypertension   Permissive hypertension for now until otherwise directed by neurology  As needed intravenous antihypertensives for markedly elevated blood pressure.    Type 2 diabetes mellitus with complication, without long-term current use of insulin ([place])  . Patient been placed on Accu-Cheks every 6 hours with sliding scale insulin while n.p.o. . Holding home regimen of hypoglycemics . Hemoglobin A1C ordered     Mixed diabetic hyperlipidemia associated with type 2 diabetes mellitus [facility])   Patient states that she is allergic to "every" statin  We will continue home regimen of fenofibrate, defer further initiation of lipid-lowering agents to neurology.    Major depressive disorder  Continue home regimen of psychotropic therapy    Panic disorder  Continue home regimen of psychotropic therapy    Code Status:  Full code Family Communication: deferred   Status is: Observation  The patient remains OBS appropriate and will d/c before 2 midnights.  Dispo: The patient is from: Home              Anticipated d/c is to: Home              Anticipated d/c date is: 2 days              Patient currently is not medically stable to d/c.        Vernelle Emerald MD [facility] Pager [phone]  If 7PM-7AM, please contact night-coverage [email or web address] Use universal [place] password for that web site. If you do not have the password, please call the hospital operator.  03/08/2020, 6:51 AM

## 2020-03-08 NOTE — ED Notes (Signed)
Patient transported to CT 

## 2020-03-09 DIAGNOSIS — I639 Cerebral infarction, unspecified: Principal | ICD-10-CM

## 2020-03-09 LAB — CBC WITH DIFFERENTIAL/PLATELET
Abs Immature Granulocytes: 0.02 10*3/uL (ref 0.00–0.07)
Basophils Absolute: 0.1 10*3/uL (ref 0.0–0.1)
Basophils Relative: 1 %
Eosinophils Absolute: 0.3 10*3/uL (ref 0.0–0.5)
Eosinophils Relative: 4 %
HCT: 38 % (ref 36.0–46.0)
Hemoglobin: 13.5 g/dL (ref 12.0–15.0)
Immature Granulocytes: 0 %
Lymphocytes Relative: 51 %
Lymphs Abs: 4.1 10*3/uL — ABNORMAL HIGH (ref 0.7–4.0)
MCH: 31.7 pg (ref 26.0–34.0)
MCHC: 35.5 g/dL (ref 30.0–36.0)
MCV: 89.2 fL (ref 80.0–100.0)
Monocytes Absolute: 0.4 10*3/uL (ref 0.1–1.0)
Monocytes Relative: 5 %
Neutro Abs: 3.2 10*3/uL (ref 1.7–7.7)
Neutrophils Relative %: 39 %
Platelets: 196 10*3/uL (ref 150–400)
RBC: 4.26 MIL/uL (ref 3.87–5.11)
RDW: 12.3 % (ref 11.5–15.5)
WBC: 8.1 10*3/uL (ref 4.0–10.5)
nRBC: 0 % (ref 0.0–0.2)

## 2020-03-09 LAB — COMPREHENSIVE METABOLIC PANEL
ALT: 37 U/L (ref 0–44)
AST: 21 U/L (ref 15–41)
Albumin: 4.1 g/dL (ref 3.5–5.0)
Alkaline Phosphatase: 38 U/L (ref 38–126)
Anion gap: 10 (ref 5–15)
BUN: 14 mg/dL (ref 6–20)
CO2: 28 mmol/L (ref 22–32)
Calcium: 9.4 mg/dL (ref 8.9–10.3)
Chloride: 99 mmol/L (ref 98–111)
Creatinine, Ser: 0.55 mg/dL (ref 0.44–1.00)
GFR calc Af Amer: >60 mL/min (ref >60)
GFR calc non Af Amer: >60 mL/min (ref >60)
Glucose, Bld: 186 mg/dL — ABNORMAL HIGH (ref 70–99)
Potassium: 4.2 mmol/L (ref 3.5–5.1)
Sodium: 137 mmol/L (ref 135–145)
Total Bilirubin: 0.6 mg/dL (ref 0.3–1.2)
Total Protein: 6.7 g/dL (ref 6.5–8.1)

## 2020-03-09 LAB — GLUCOSE, CAPILLARY
Glucose-Capillary: 240 mg/dL — ABNORMAL HIGH (ref 70–99)
Glucose-Capillary: 103 mg/dL — ABNORMAL HIGH (ref 70–99)

## 2020-03-09 LAB — MAGNESIUM: Magnesium: 1.9 mg/dL (ref 1.7–2.4)

## 2020-03-09 LAB — CBG MONITORING, ED: Glucose-Capillary: 225 mg/dL — ABNORMAL HIGH (ref 70–99)

## 2020-03-09 MED ORDER — ROSUVASTATIN CALCIUM 40 MG PO TABS: 40.0000 mg | ORAL_TABLET | Freq: Every day | ORAL | 0 refills | Status: AC

## 2020-03-09 MED ORDER — METFORMIN HCL 500 MG PO TABS: 500.0000 mg | ORAL_TABLET | Freq: Two times a day (BID) | ORAL | 0 refills | Status: AC

## 2020-03-09 MED ORDER — NICOTINE 14 MG/24HR TD PT24: 14.0000 mg | MEDICATED_PATCH | Freq: Every day | TRANSDERMAL | 0 refills | Status: AC

## 2020-03-09 MED ORDER — ASPIRIN EC 81 MG PO TBEC: 81.0000 mg | DELAYED_RELEASE_TABLET | Freq: Every day | ORAL | 0 refills | Status: AC

## 2020-03-09 NOTE — Progress Notes (Signed)
SLP Cancellation Note  Patient Details Name: Peggy Ryan MRN: [id number] DOB: January 28, 1961   Cancelled treatment:       Reason Eval/Treat Not Completed: Other (comment) (pt transfered to [facility], SLP will follow up at this venue.  Kathleen Lime, MS [facility] SLP Acute Rehab Services Office [phone]    Macario Golds 03/09/2020, 1:00 PM

## 2020-03-09 NOTE — ED Notes (Signed)
Per [facility]- 15 min out.

## 2020-03-09 NOTE — Progress Notes (Signed)
Occupational Therapy Evaluation Patient Details Name: Peggy Ryan MRN: [id number] DOB: 06/19/1960 Today's Date: 03/09/2020    History of Present Illness 59 y.o. female with a PMHx of DM and HTN, current smoker, who presented on Saturday evening with a 3 day history of right sided numbness, impaired coordination and speech difficulty. 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    Clinical Impression   PTA, pt lived at home with her significant other and was independent with ADL and mobility. Occasionally uses a cane when her "sciatica bothers her". 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 Pt presents with decreased fine motor control/coordination and in-hand manipulation skills R hand, apparent balance deficits in addition to mild cognitive deficits. Pt states she had 3 falls in 2 days.  Pt demonstrates a LOB when challenged or during head turns.Currently requires Min guard A for ADL and Min A for mobility. Recommend pt have direct assistance with mobility from her significant other and follow up with OT at the neuro outpt center. Pt is agreeable. Will follow acutely.     Follow Up Recommendations  Supervision/Assistance - 24 hour;Outpatient OT (neuro outpt)    Equipment Recommendations  3 in 1 bedside commode (to use as showe seat)    Recommendations for Other Services PT consult;Speech consult     Precautions / Restrictions Precautions Precautions: Fall Restrictions Weight Bearing Restrictions: No      Mobility Bed Mobility Overal bed mobility: Independent                Transfers Overall transfer level: Needs assistance Equipment used: Rolling walker (2 wheeled) Transfers: Sit to/from [facility] Sit to Stand: Min guard Stand pivot transfers: Min assist       General transfer comment: LOB when turning or when turning head    Balance Overall balance assessment: Needs  assistance   Sitting balance-Leahy Scale: Good       Standing balance-Leahy Scale: Poor                             ADL either performed or assessed with clinical judgement   ADL Overall ADL's : Needs assistance/impaired Eating/Feeding: Modified independent   Grooming: Set up;Sitting   Upper Body Bathing: Set up;Sitting   Lower Body Bathing: Min guard;Sit to/from stand   Upper Body Dressing : Set up;Sitting   Lower Body Dressing: Min guard;Sit to/from stand   Toilet Transfer: Minimal assistance;Ambulation   Toileting- Clothing Manipulation and Hygiene: Supervision/safety       Functional mobility during ADLs: Minimal assistance General ADL Comments: Pt loses her balance to her R at times, requiring Mn A; used RW, however pt listing to R when distracted.      Vision Baseline Vision/History: Wears glasses Wears Glasses: Reading only Patient Visual Report: No change from baseline Additional Comments: Pt reading functionally; no complaints of changes; appeasr intact     Perception     Praxis Praxis Praxis tested?: Within functional limits    Pertinent Vitals/Pain Pain Assessment: No/denies pain     Hand Dominance Right   Extremity/Trunk Assessment Upper Extremity Assessment Upper Extremity Assessment: RUE deficits/detail RUE Deficits / Details: AROM overall WFL; difficulty with in-hand manipulation skills; "clumsy- hand" using her hand functionally to text which she states is "better" RUE Coordination: decreased fine motor   Lower Extremity Assessment Lower Extremity Assessment: Defer to PT evaluation  Cervical / Trunk Assessment Cervical / Trunk Assessment: Other exceptions (R bias)   Communication Communication Communication: Expressive difficulties   Cognition Arousal/Alertness: Awake/alert Behavior During Therapy: WFL for tasks assessed/performed Overall Cognitive Status: Impaired/Different from baseline Area of Impairment:  Attention;Safety/judgement;Awareness;Memory                   Current Attention Level: Selective Memory: Decreased short-term memory   Safety/Judgement: Decreased awareness of safety;Decreased awareness of deficits Awareness: Emergent   General Comments: Unable to serial substract - states she couldn't do this at baseline; difficulty with delayed recall  - remembered 2/3 items; staets "I was little confused"   General Comments  fell 3 times in 2 days    Exercises Exercises: Hand exercises;Other exercises Other Exercises Other Exercises: in-hand manipulation skills Other Exercises: fine motor coordination   Shoulder Instructions      Home Living Family/patient expects to be discharged to:: Private residence Living Arrangements: Spouse/significant other Available Help at Discharge: Friend(s);Available PRN/intermittently Type of Home: Other(Comment) (condo) Home Access: Level entry     Home Layout: One level     Bathroom Shower/Tub: Tub/shower unit;Curtain   Biochemist, clinical: Standard Bathroom Accessibility: Yes How Accessible: Accessible via walker Home Equipment: Cane - single point          Prior Functioning/Environment Level of Independence: Independent        Comments: occasionally uses cane when sciatica bothers her; 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; does "nothing much" has [place] dog (does not drive; does her own medication and financial manage)        OT Problem List: Decreased strength;Impaired balance (sitting and/or standing);Decreased coordination;Decreased safety awareness;Decreased knowledge of use of DME or AE;Impaired UE functional use      OT Treatment/Interventions: Self-care/ADL training;Therapeutic exercise;Neuromuscular education;DME and/or AE instruction;Therapeutic activities;Patient/family education;Balance training    OT Goals(Current goals can be found in the care plan section) Acute Rehab OT  Goals Patient Stated Goal: to go home OT Goal Formulation: With patient Time For Goal Achievement: 03/23/20 Potential to Achieve Goals: Good  OT Frequency: Min 2X/week   Barriers to D/C:            Co-evaluation              AM-PAC OT "6 Clicks" Daily Activity     Outcome Measure Help from another person eating meals?: None Help from another person taking care of personal grooming?: A Little Help from another person toileting, which includes using toliet, bedpan, or urinal?: A Little Help from another person bathing (including washing, rinsing, drying)?: A Little Help from another person to put on and taking off regular upper body clothing?: A Little Help from another person to put on and taking off regular lower body clothing?: A Little 6 Click Score: 19   End of Session Equipment Utilized During Treatment: Gait belt;Rolling walker Nurse Communication: Mobility status;Other (comment) (DC needs)  Activity Tolerance: Patient tolerated treatment well Patient left: in bed;with call bell/phone within reach;with bed alarm set  OT Visit Diagnosis: Unsteadiness on feet (R26.81);Other abnormalities of gait and mobility (R26.89);Repeated falls (R29.6);Muscle weakness (generalized) (M62.81);Other symptoms and signs involving cognitive function                Time: 1420-1445 OT Time Calculation (min): 25 min Charges:  OT General Charges $OT Visit: 1 Visit OT Evaluation $OT Eval Moderate Complexity: 1 Mod OT Treatments $Self Care/Home Management : 8-22 mins  [facility], OT/L   Acute  OT Clinical Specialist Acute Rehabilitation Services Pager [phone] Office [phone]   [facility] 03/09/2020, 2:53 PM

## 2020-03-09 NOTE — ED Notes (Signed)
Pt moved from ED stretcher to hospital bed. Pt ambulated to bed without any difficulty.

## 2020-03-09 NOTE — TOC Transition Note (Signed)
Transition of Care [facility]) - CM/SW Discharge Note   Patient Details  Name: Peggy Ryan MRN: [id number] Date of Birth: April 17, 1961  [facility] CM/SW Contact:  Laurena Slimmer, RN Phone Number: 03/09/2020, 9:46 PM   Clinical Narrative:       Final next level of care: Home/Self Care     Patient Goals and CMS Choice Patient states their goals for this hospitalization and ongoing recovery are:: go home to get better      Discharge Placement                       Discharge Plan and Services  Patient being discharged home s/p CVA HH recommended patient declined prefers [facility] CM placed referral to the [facility] at patient's request.  Informed patient that someone will call her to  schedule her appt. CM provided DME  3n1 chair for patient to discharge home with.  No further ED CM needs identified  CM updated the RN on 2W ion discharged plan              DME Arranged: 3-N-1 DME Agency: [facility]                  Social Determinants of Health ([place]) Interventions     Readmission Risk Interventions No flowsheet data found.

## 2020-03-09 NOTE — Plan of Care (Signed)
  Problem: Education: Goal: Knowledge of General Education information will improve Description: Including pain rating scale, medication(s)/side effects and non-pharmacologic comfort measures Outcome: Completed/Met   Problem: Health Behavior/Discharge Planning: Goal: Ability to manage health-related needs will improve Outcome: Completed/Met   Problem: Clinical Measurements: Goal: Ability to maintain clinical measurements within normal limits will improve Outcome: Completed/Met Goal: Will remain free from infection Outcome: Completed/Met Goal: Diagnostic test results will improve Outcome: Completed/Met Goal: Respiratory complications will improve Outcome: Completed/Met Goal: Cardiovascular complication will be avoided Outcome: Completed/Met   Problem: Activity: Goal: Risk for activity intolerance will decrease Outcome: Completed/Met   Problem: Nutrition: Goal: Adequate nutrition will be maintained Outcome: Completed/Met   Problem: Coping: Goal: Level of anxiety will decrease Outcome: Completed/Met   Problem: Elimination: Goal: Will not experience complications related to bowel motility Outcome: Completed/Met Goal: Will not experience complications related to urinary retention Outcome: Completed/Met   Problem: Pain Managment: Goal: General experience of comfort will improve Outcome: Completed/Met   Problem: Safety: Goal: Ability to remain free from injury will improve Outcome: Completed/Met   Problem: Skin Integrity: Goal: Risk for impaired skin integrity will decrease Outcome: Completed/Met   Problem: Education: Goal: Knowledge of disease or condition will improve Outcome: Completed/Met Goal: Knowledge of secondary prevention will improve Outcome: Completed/Met Goal: Knowledge of patient specific risk factors addressed and post discharge goals established will improve Outcome: Completed/Met

## 2020-03-09 NOTE — Consult Note (Addendum)
Referring Physician: Dr. Posey Pronto    Chief Complaint: Right sided weakness  HPI: Peggy Ryan is an 59 y.o. female with a PMHx of DM and HTN, current smoker, who presented on Saturday evening with a 3 day history of right sided numbness, impaired coordination and speech difficulty. Head CT revealed a hypodensity within the left pons. Teleneurology was consulted and recommended a full stroke work up. 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   Further history obtained from chart. Per EDP note: "This is a 59 year old female with a history of diabetes who presents with concerns for stroke.  Patient reports that she has noted right facial droop, right hand weakness and "heaviness in my right foot."  She reports onset of symptoms Wednesday.  She states she last felt normal on Tuesday.  She did have some dental work on Tuesday and deep cleaning but does not believe that she had any facial droop at that time.  She states she has had some difficulty speaking and feels like her words are not coming out correctly.  She also reports difficulty texting and writing.  She is right-hand dominant.  She denies headache.  Denies recent fevers or illness.  No known sick contacts or Covid exposures.  She does report some chest pain which started this evening.  She states that it is burning and was postprandial.  There are no exertional symptoms.  No shortness of breath, leg swelling."  LSN: 3 days prior to admission tPA Given: No: Out of time window.   Past Medical History:  Diagnosis Date  . Anxiety   . Diabetes mellitus   . Panic attacks     Past Surgical History:  Procedure Laterality Date  . CARPAL TUNNEL RELEASE    . CHOLECYSTECTOMY    . TONSILLECTOMY      Family History  Problem Relation Age of Onset  . Heart disease Father    Social History:  reports that she has quit smoking. She quit smokeless tobacco use about 11 years ago. She reports that she does not use drugs. No  history on file for alcohol use.  Allergies:  Allergies  Allergen Reactions  . Levofloxacin Anaphylaxis  . Latex Rash  . Sulfa Antibiotics Rash    Medications:  No current facility-administered medications on file prior to encounter.   Current Outpatient Medications on File Prior to Encounter  Medication Sig Dispense Refill  . buPROPion (WELLBUTRIN) 75 MG tablet Take 75 mg by mouth daily.     . citalopram (CELEXA) 40 MG tablet Take 40 mg by mouth daily.    . fenofibrate (TRICOR) 145 MG tablet Take 145 mg by mouth daily.    Marland Kitchen glimepiride (AMARYL) 1 MG tablet Take 2 mg by mouth in the morning and at bedtime.     . hydrochlorothiazide (HYDRODIURIL) 25 MG tablet Take 12.5 mg by mouth daily.     Marland Kitchen lisinopril (PRINIVIL,ZESTRIL) 10 MG tablet Take 10 mg by mouth daily.    Marland Kitchen LORazepam (ATIVAN) 0.5 MG tablet Take 0.25-0.5 mg by mouth 4 (four) times daily.    . metFORMIN (GLUCOPHAGE) 500 MG tablet Take 500 mg by mouth 2 (two) times daily with a meal.      Scheduled: .  stroke: mapping our early stages of recovery book   Does not apply Once  . aspirin  81 mg Oral Daily  . buPROPion  75 mg Oral Daily  . citalopram  40 mg Oral Daily  . fenofibrate  160  mg Oral Daily  . insulin aspart  0-15 Units Subcutaneous TID WC  . insulin aspart  0-5 Units Subcutaneous QHS  . insulin aspart  4 Units Subcutaneous TID WC  . LORazepam  0.5 mg Oral QID  . nicotine  14 mg Transdermal Daily  . rosuvastatin  40 mg Oral q1800   Continuous:   ROS: As per HPI. Comprehensive ROS otherwise negative.    Physical Examination: Blood pressure 137/77, pulse 81, temperature 98.2 F (36.8 C), temperature source Oral, resp. rate 17, height 5\' 1"  (1.549 m), weight 70.3 kg, SpO2 97 %.  HEENT: [place]/AT Lungs: Respirations unlabored Ext: No edema  Neurologic Examination: Mental Status: Alert, fully oriented, thought content appropriate.  Speech fluent without evidence of aphasia.  Able to follow all commands without  difficulty. Mild/subtle dysarthria is noted.  Cranial Nerves: II:  Visual fields intact with no extinction to DSS. PERRL.  III,IV, VI: No ptosis. EOMI. No nystagmus.   V,VII: Subtle right facial droop. Facial temp sensation equal bilaterally VIII: Hearing intact to voice IX,X: No hypophonia XI: Subtle right sided lag with shoulder shrug XII: Midline tongue extension  Motor: RUE: 5/5 except for subtle lag with shoulder shrug and 4+/5 right deltoid.  LUE: 5/5 RLE 5/5 except for 4/5 toe dorsiflexion LLE 5/5 No pronator drift.  Positive orbiting fingers test on the right Sensory: Temp and light touch intact throughout, bilaterally. No asymmetry Deep Tendon Reflexes:  2+ bilateral brachioradialis and patellae.  Plantars: Right: upgoing   Left: downgoing Cerebellar: No ataxia with FNF bilaterally  Gait: Deferred  Results for orders placed or performed during the hospital encounter of 03/07/20 (from the past 48 hour(s))  Basic metabolic panel     Status: Abnormal   Collection Time: 03/07/20 10:30 PM  Result Value Ref Range   Sodium 132 (L) 135 - 145 mmol/L   Potassium 4.1 3.5 - 5.1 mmol/L   Chloride 94 (L) 98 - 111 mmol/L   CO2 25 22 - 32 mmol/L   Glucose, Bld 199 (H) 70 - 99 mg/dL    Comment: Glucose reference range applies only to samples taken after fasting for at least 8 hours.   BUN 18 6 - 20 mg/dL   Creatinine, Ser 0.66 0.44 - 1.00 mg/dL   Calcium 10.1 8.9 - 10.3 mg/dL   GFR calc non Af Amer >60 >60 mL/min   GFR calc Af Amer >60 >60 mL/min   Anion gap 13 5 - 15    Comment: Performed at [facility], [place] [address] [place],  [address]  CBC     Status: None   Collection Time: 03/07/20 10:30 PM  Result Value Ref Range   WBC 9.2 4.0 - 10.5 K/uL   RBC 4.58 3.87 - 5.11 MIL/uL   Hemoglobin 14.4 12.0 - 15.0 g/dL   HCT 40.7 36 - 46 %   MCV 88.9 80.0 - 100.0 fL   MCH 31.4 26.0 - 34.0 pg   MCHC 35.4 30.0 - 36.0 g/dL   RDW 12.4 11.5 - 15.5 %    Platelets 230 150 - 400 K/uL   nRBC 0.0 0.0 - 0.2 %    Comment: Performed at [facility], [place] [address] [place], [place] [address]  Troponin I (High Sensitivity)     Status: None   Collection Time: 03/07/20 10:30 PM  Result Value Ref Range   Troponin I (High Sensitivity) 4 <18 ng/L    Comment: (NOTE) Elevated high sensitivity troponin  I (hsTnI) values and significant  changes across serial measurements may suggest ACS but many other  chronic and acute conditions are known to elevate hsTnI results.  Refer to the "Links" section for chest pain algorithms and additional  guidance. Performed at [facility], [place] [address], [place], [place] [address]   I-Stat beta hCG blood, ED     Status: None   Collection Time: 03/07/20 10:42 PM  Result Value Ref Range   I-stat hCG, quantitative <5.0 <5 mIU/mL   Comment 3            Comment:   GEST. AGE      CONC.  (mIU/mL)   <=1 WEEK        5 - 50     2 WEEKS       50 - 500     3 WEEKS       100 - 10,000     4 WEEKS     1,000 - 30,000        FEMALE AND NON-PREGNANT FEMALE:     LESS THAN 5 mIU/mL   Troponin I (High Sensitivity)     Status: None   Collection Time: 03/07/20 11:57 PM  Result Value Ref Range   Troponin I (High Sensitivity) 4 <18 ng/L    Comment: (NOTE) Elevated high sensitivity troponin I (hsTnI) values and significant  changes across serial measurements may suggest ACS but many other  chronic and acute conditions are known to elevate hsTnI results.  Refer to the "Links" section for chest pain algorithms and additional  guidance. Performed at [facility], [place] [address] [place], [place] [address]   Ethanol     Status: None   Collection Time: 03/07/20 11:57 PM  Result Value Ref Range   Alcohol, Ethyl (B) <10 <10 mg/dL    Comment: (NOTE) Lowest detectable limit for serum alcohol is 10 mg/dL.  For medical purposes only. Performed at [facility],  [place] [address], [place], [place] [address]   Protime-INR     Status: None   Collection Time: 03/07/20 11:57 PM  Result Value Ref Range   Prothrombin Time 11.9 11.4 - 15.2 seconds   INR 0.9 0.8 - 1.2    Comment: (NOTE) INR goal varies based on device and disease states. Performed at [facility], [place] [address], [place], [place] [address]   APTT     Status: None   Collection Time: 03/07/20 11:57 PM  Result Value Ref Range   aPTT 27 24 - 36 seconds    Comment: Performed at [facility], [place] [address] [place], [place] [address]  Differential     Status: Abnormal   Collection Time: 03/07/20 11:57 PM  Result Value Ref Range   Neutrophils Relative % 48 %   Neutro Abs 4.7 1.7 - 7.7 K/uL   Lymphocytes Relative 44 %   Lymphs Abs 4.3 (H) 0.7 - 4.0 K/uL   Monocytes Relative 5 %   Monocytes Absolute 0.5 0 - 1 K/uL   Eosinophils Relative 2 %   Eosinophils Absolute 0.2 0 - 0 K/uL   Basophils Relative 1 %   Basophils Absolute 0.1 0 - 0 K/uL   Immature Granulocytes 0 %   Abs Immature Granulocytes 0.02 0.00 - 0.07 K/uL    Comment: Performed at [facility], [place] [address], [place],  [address]  Urine rapid drug screen (hosp performed)  Status: None   Collection Time: 03/07/20 11:57 PM  Result Value Ref Range   Opiates NONE DETECTED NONE DETECTED   Cocaine NONE DETECTED NONE DETECTED   Benzodiazepines NONE DETECTED NONE DETECTED   Amphetamines NONE DETECTED NONE DETECTED   Tetrahydrocannabinol NONE DETECTED NONE DETECTED   Barbiturates NONE DETECTED NONE DETECTED    Comment: (NOTE) DRUG SCREEN FOR MEDICAL PURPOSES ONLY.  IF CONFIRMATION IS NEEDED FOR ANY PURPOSE, NOTIFY LAB WITHIN 5 DAYS.  LOWEST DETECTABLE LIMITS FOR URINE DRUG SCREEN Drug Class                     Cutoff (ng/mL) Amphetamine and metabolites    1000 Barbiturate and metabolites    200 Benzodiazepine                 101 Tricyclics and metabolites      300 Opiates and metabolites        300 Cocaine and metabolites        300 THC                            50 Performed at [facility], [place] [address] [place], [place] [address]   Urinalysis, Routine w reflex microscopic Urine, Clean Catch     Status: Abnormal   Collection Time: 03/07/20 11:57 PM  Result Value Ref Range   Color, Urine STRAW (A) YELLOW   APPearance CLEAR CLEAR   Specific Gravity, Urine 1.013 1.005 - 1.030   pH 6.0 5.0 - 8.0   Glucose, UA NEGATIVE NEGATIVE mg/dL   Hgb urine dipstick NEGATIVE NEGATIVE   Bilirubin Urine NEGATIVE NEGATIVE   Ketones, ur NEGATIVE NEGATIVE mg/dL   Protein, ur NEGATIVE NEGATIVE mg/dL   Nitrite NEGATIVE NEGATIVE   Leukocytes,Ua NEGATIVE NEGATIVE    Comment: Performed at [place] [address], [place], [place] [address]  Respiratory Panel by RT PCR (Flu A&B, Covid) - Urine, Clean Catch     Status: None   Collection Time: 03/07/20 11:57 PM   Specimen: Urine, Clean Catch; Nasopharyngeal  Result Value Ref Range   SARS Coronavirus 2 by RT PCR NEGATIVE NEGATIVE    Comment: (NOTE) SARS-CoV-2 target nucleic acids are NOT DETECTED.  The SARS-CoV-2 RNA is generally detectable in upper respiratoy specimens during the acute phase of infection. The lowest concentration of SARS-CoV-2 viral copies this assay can detect is 131 copies/mL. A negative result does not preclude SARS-Cov-2 infection and should not be used as the sole basis for treatment or other patient management decisions. A negative result may occur with  improper specimen collection/handling, submission of specimen other than nasopharyngeal swab, presence of viral mutation(s) within the areas targeted by this assay, and inadequate number of viral copies (<131 copies/mL). A negative result must be combined with clinical observations, patient history, and epidemiological information. The expected result is Negative.  Fact Sheet for  Patients:  PinkCheek.be  Fact Sheet for Healthcare Providers:  GravelBags.it  This test is no t yet approved or cleared by the [place] FDA and  has been authorized for detection and/or diagnosis of SARS-CoV-2 by FDA under an Emergency Use Authorization (EUA). This EUA will remain  in effect (meaning this test can be used) for the duration of the COVID-19 declaration under Section 564(b)(1) of the Act, 21 U.S.C. section 360bbb-3(b)(1), unless the authorization is terminated or revoked sooner.     Influenza A by PCR  NEGATIVE NEGATIVE   Influenza B by PCR NEGATIVE NEGATIVE    Comment: (NOTE) The Xpert Xpress SARS-CoV-2/FLU/RSV assay is intended as an aid in  the diagnosis of influenza from Nasopharyngeal swab specimens and  should not be used as a sole basis for treatment. Nasal washings and  aspirates are unacceptable for Xpert Xpress SARS-CoV-2/FLU/RSV  testing.  Fact Sheet for Patients: PinkCheek.be  Fact Sheet for Healthcare Providers: GravelBags.it  This test is not yet approved or cleared by the [place] FDA and  has been authorized for detection and/or diagnosis of SARS-CoV-2 by  FDA under an Emergency Use Authorization (EUA). This EUA will remain  in effect (meaning this test can be used) for the duration of the  Covid-19 declaration under Section 564(b)(1) of the Act, 21  U.S.C. section 360bbb-3(b)(1), unless the authorization is  terminated or revoked. Performed at [facility], [place] [address], [place], [place] [address]   HIV Antibody (routine testing w rflx)     Status: None   Collection Time: 03/08/20  6:59 AM  Result Value Ref Range   HIV Screen 4th Generation wRfx Non Reactive Non Reactive    Comment: Performed at [facility], [place] [address], [place], [place] [address]  Hemoglobin A1c     Status: Abnormal    Collection Time: 03/08/20  6:59 AM  Result Value Ref Range   Hgb A1c MFr Bld 8.1 (H) 4.8 - 5.6 %    Comment: (NOTE) Pre diabetes:          5.7%-6.4%  Diabetes:              >6.4%  Glycemic control for   <7.0% adults with diabetes    Mean Plasma Glucose 185.77 mg/dL    Comment: Performed at [place] [address] [place], [place] [address]  Lipid panel     Status: Abnormal   Collection Time: 03/08/20  6:59 AM  Result Value Ref Range   Cholesterol 325 (H) 0 - 200 mg/dL   Triglycerides 665 (H) <150 mg/dL   HDL 30 (L) >40 mg/dL   Total CHOL/HDL Ratio 10.8 RATIO   VLDL UNABLE TO CALCULATE IF TRIGLYCERIDE OVER 400 mg/dL 0 - 40 mg/dL   LDL Cholesterol UNABLE TO CALCULATE IF TRIGLYCERIDE OVER 400 mg/dL 0 - 99 mg/dL    Comment:        Total Cholesterol/HDL:CHD Risk Coronary Heart Disease Risk Table                     Men   Women  1/2 Average Risk   3.4   3.3  Average Risk       5.0   4.4  2 X Average Risk   9.6   7.1  3 X Average Risk  23.4   11.0        Use the calculated Patient Ratio above and the CHD Risk Table to determine the patient's CHD Risk.        ATP III CLASSIFICATION (LDL):  <100     mg/dL   Optimal  100-129  mg/dL   Near or Above                    Optimal  130-159  mg/dL   Borderline  160-189  mg/dL   High  >190     mg/dL   Very High Performed at [place] [address] [place], [place] [address]  Comprehensive metabolic panel     Status: Abnormal   Collection Time: 03/08/20  6:59 AM  Result Value Ref Range   Sodium 134 (L) 135 - 145 mmol/L   Potassium 3.8 3.5 - 5.1 mmol/L   Chloride 97 (L) 98 - 111 mmol/L   CO2 25 22 - 32 mmol/L   Glucose, Bld 209 (H) 70 - 99 mg/dL    Comment: Glucose reference range applies only to samples taken after fasting for at least 8 hours.   BUN 14 6 - 20 mg/dL   Creatinine, Ser 0.55 0.44 - 1.00 mg/dL   Calcium 9.5 8.9 - 10.3 mg/dL   Total Protein 7.1 6.5 - 8.1 g/dL   Albumin 4.2 3.5 -  5.0 g/dL   AST 25 15 - 41 U/L   ALT 42 0 - 44 U/L   Alkaline Phosphatase 40 38 - 126 U/L   Total Bilirubin 0.6 0.3 - 1.2 mg/dL   GFR calc non Af Amer >60 >60 mL/min   GFR calc Af Amer >60 >60 mL/min   Anion gap 12 5 - 15    Comment: Performed at [facility], [place] [address] [place], [place] [address]  CBC     Status: None   Collection Time: 03/08/20  6:59 AM  Result Value Ref Range   WBC 9.0 4.0 - 10.5 K/uL   RBC 4.43 3.87 - 5.11 MIL/uL   Hemoglobin 14.1 12.0 - 15.0 g/dL   HCT 39.2 36 - 46 %   MCV 88.5 80.0 - 100.0 fL   MCH 31.8 26.0 - 34.0 pg   MCHC 36.0 30.0 - 36.0 g/dL   RDW 12.4 11.5 - 15.5 %   Platelets 223 150 - 400 K/uL   nRBC 0.0 0.0 - 0.2 %    Comment: Performed at [facility], [place] [address] [place], [place] [address]  LDL cholesterol, direct     Status: Abnormal   Collection Time: 03/08/20  6:59 AM  Result Value Ref Range   Direct LDL 160.4 (H) 0 - 99 mg/dL    Comment: Performed at [place] [address] [place], [place] [address]  CBG monitoring, ED     Status: Abnormal   Collection Time: 03/08/20  7:49 AM  Result Value Ref Range   Glucose-Capillary 227 (H) 70 - 99 mg/dL    Comment: Glucose reference range applies only to samples taken after fasting for at least 8 hours.  CBG monitoring, ED     Status: Abnormal   Collection Time: 03/08/20 12:13 PM  Result Value Ref Range   Glucose-Capillary 198 (H) 70 - 99 mg/dL    Comment: Glucose reference range applies only to samples taken after fasting for at least 8 hours.  CBG monitoring, ED     Status: Abnormal   Collection Time: 03/08/20  5:47 PM  Result Value Ref Range   Glucose-Capillary 310 (H) 70 - 99 mg/dL    Comment: Glucose reference range applies only to samples taken after fasting for at least 8 hours.  CBG monitoring, ED     Status: Abnormal   Collection Time: 03/08/20 11:58 PM  Result Value Ref Range   Glucose-Capillary 174 (H) 70 - 99 mg/dL     Comment: Glucose reference range applies only to samples taken after fasting for at least 8 hours.   DG Chest 2 View  Result Date: 03/07/2020 CLINICAL DATA:  Chest pain.  Possible stroke 3 days ago. EXAM: CHEST -  2 VIEW COMPARISON:  None. FINDINGS: The heart size and mediastinal contours are within normal limits. Both lungs are clear. The visualized skeletal structures are unremarkable. IMPRESSION: No active cardiopulmonary disease. Electronically Signed   By: Lucienne Capers M.D.   On: 03/07/2020 22:43   CT Head Wo Contrast  Result Date: 03/08/2020 CLINICAL DATA:  Altered sensation of the right side of face, concern for stroke EXAM: CT HEAD WITHOUT CONTRAST TECHNIQUE: Contiguous axial images were obtained from the base of the skull through the vertex without intravenous contrast. COMPARISON:  None. FINDINGS: Brain: No evidence of acute infarction, hemorrhage, hydrocephalus, extra-axial collection, visible mass lesion or mass effect. Vascular: Atherosclerotic calcification of the carotid siphons and right intradural vertebral artery. No hyperdense vessel. Skull: No calvarial fracture or suspicious osseous lesion. No scalp swelling or hematoma. Sinuses/Orbits: Paranasal sinuses and mastoid air cells are predominantly clear. Included orbital structures are unremarkable. Other: None IMPRESSION: No acute intracranial abnormality. If there is persisting clinical concern for infarct, MRI is more sensitive and specific for early changes of ischemia. Intracranial atherosclerosis. Electronically Signed   By: Lovena Le M.D.   On: 03/08/2020 01:29   MR ANGIO HEAD WO CONTRAST  Result Date: 03/08/2020 CLINICAL DATA:  Stroke suspected. Neuro deficit. Right-sided facial numbness. EXAM: MRA HEAD WITHOUT CONTRAST TECHNIQUE: Angiographic images of the Circle of Willis were obtained using MRA technique without intravenous contrast. COMPARISON:  None. FINDINGS: Evaluation is limited by patient motion. Loss of  flow-related signal in the proximal intradural V4 left vertebral artery is concerning for occlusion or high-grade stenosis. There is evidence of likely retrograde opacification of the distal left vertebral artery and PICA. No significant (greater than 50%) stenosis or occlusion of the right vertebral artery, the basilar artery, the anterior circulation, or posterior circulation. Try for CT a Ca, anatomic variant. No aneurysm identified; however, patient motion limits evaluation. IMPRESSION: Motion limited examination. 1. With loss of flow-related signal in the proximal left intradural V4 vertebral artery is concerning for occlusion or high-grade stenosis proximally of the left vertebral artery. There is evidence of likely retrograde opacification of the distal left vertebral artery and PICA. 2. Otherwise, no evidence of significant arterial stenosis or occlusion intracranially. Electronically Signed   By: Margaretha Sheffield MD   On: 03/08/2020 11:26   MR ANGIO NECK WO CONTRAST  Result Date: 03/08/2020 CLINICAL DATA:  Right-sided facial this.  Stroke suspected. EXAM: MRA NECK WITHOUT CONTRAST TECHNIQUE: Angiographic images of the neck were obtained using MRA technique without intravenous contrast. Carotid stenosis measurements (when applicable) are obtained utilizing NASCET criteria, using the distal internal carotid diameter as the denominator. COMPARISON:  None. FINDINGS: Evaluation is limited by patient motion. The left vertebral artery is diminutive throughout its course with loss of flow related signal in the upper neck. Reconstitution of signal in the distal left V4 vertebral artery may be from retrograde flow. Evaluation of the remaining vasculature in the neck is limited by patient motion. Likely approximately 50% stenosis of the proximal right ICA. Mild irregularity of the right vertebral artery without evidence of greater than 50% narrowing. IMPRESSION: 1. The left vertebral artery is diminutive  throughout its course with loss of flow related signal in the upper neck, compatible with high-grade stenosis or occlusion. Reconstitution of signal in the distal left V4 vertebral artery and PICA likely is from retrograde flow. 2. Evaluation of the remaining vasculature in the neck is limited by patient motion. 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 Consider  CTA of the neck to better evaluate. Electronically Signed   By: Margaretha Sheffield MD   On: 03/08/2020 11:35   MR BRAIN WO CONTRAST  Result Date: 03/08/2020 CLINICAL DATA:  Stroke workup.  Left-sided facial numbness. EXAM: MRI HEAD WITHOUT CONTRAST TECHNIQUE: Multiplanar, multiecho pulse sequences of the brain and surrounding structures were obtained without intravenous contrast. COMPARISON:  CT head 03/08/2020 FINDINGS: Brain: Restricted infusion involving the left pons, compatible with acute or early subacute infarct. Mild associated edema. No substantial mass effect. No acute hemorrhage. No hydrocephalus. Mild (age-appropriate) scattered T2/FLAIR hyperintensities within the white matter, compatible with the sequela of chronic microvascular ischemic disease. Vascular: Further characterized on concurrent MRA. Skull and upper cervical spine: Limited evaluation given motion. No evidence of acute abnormality. Sinuses/Orbits: Negative. Other: No mastoid effusions. IMPRESSION: Acute/early subacute left pontine infarct with mild associated edema. No substantial mass effect. No hydrocephalus. Electronically Signed   By: Margaretha Sheffield MD   On: 03/08/2020 11:18   ECHOCARDIOGRAM COMPLETE BUBBLE STUDY  Result Date: 03/08/2020    ECHOCARDIOGRAM REPORT   Patient Name:   Peggy Ryan Date of Exam: 03/08/2020 Medical Rec #:  [id number]       Height:       61.0 in Accession #:    [id number]      Weight:       155.0 lb Date of Birth:  08/05/1960      BSA:          1.695 m Patient Age:    25 years        BP:           120/56 mmHg Patient  Gender: F               HR:           78 bpm. Exam Location:  Inpatient Procedure: 2D Echo, Cardiac Doppler and Color Doppler Indications:    Stroke.  History:        Patient has no prior history of Echocardiogram examinations.                 Risk Factors:Hypertension, Diabetes and Dyslipidemia.  Sonographer:    Roseanna Rainbow RDCS Referring Phys: [id number] [place]  Sonographer Comments: Patient moving throughout exam. IMPRESSIONS  1. Left ventricular ejection fraction, by estimation, is 60 to 65%. The left ventricle has normal function. The left ventricle has no regional wall motion abnormalities. There is mild concentric left ventricular hypertrophy. Left ventricular diastolic parameters are consistent with Grade I diastolic dysfunction (impaired relaxation).  2. Right ventricular systolic function is normal. The right ventricular size is normal.  3. The mitral valve is normal in structure. Trivial mitral valve regurgitation. No evidence of mitral stenosis.  4. Thickening of the non-coronary cusp on short axis views. The aortic valve is not well-visualized on three chamber view. Given stroke-like symptoms, would consider TEE to better evaluate. The aortic valve is tricuspid. Aortic valve regurgitation is not visualized. No aortic stenosis is present.  5. The inferior vena cava is normal in size with greater than 50% respiratory variability, suggesting right atrial pressure of 3 mmHg.  6. Agitated saline contrast bubble study was negative, with no evidence of any interatrial shunt. FINDINGS  Left Ventricle: Left ventricular ejection fraction, by estimation, is 60 to 65%. The left ventricle has normal function. The left ventricle has no regional wall motion abnormalities. The left ventricular internal cavity size was normal in size. There is  mild concentric  left ventricular hypertrophy. Left ventricular diastolic parameters are consistent with Grade I diastolic dysfunction (impaired relaxation). Indeterminate  filling pressures. Right Ventricle: The right ventricular size is normal. No increase in right ventricular wall thickness. Right ventricular systolic function is normal. Left Atrium: Left atrial size was normal in size. Right Atrium: Right atrial size was normal in size. Pericardium: There is no evidence of pericardial effusion. Mitral Valve: The mitral valve is normal in structure. Trivial mitral valve regurgitation. No evidence of mitral valve stenosis. Tricuspid Valve: The tricuspid valve is normal in structure. Tricuspid valve regurgitation is trivial. No evidence of tricuspid stenosis. Aortic Valve: Thickening of the non-coronary cusp on short axis views. The aortic valve is not well-visualized on three chamber view. Given stroke-like symptoms, would consider TEE to better evaluate. The aortic valve is tricuspid. Aortic valve regurgitation is not visualized. No aortic stenosis is present. Pulmonic Valve: The pulmonic valve was normal in structure. Pulmonic valve regurgitation is not visualized. No evidence of pulmonic stenosis. Aorta: The aortic root is normal in size and structure. Venous: The inferior vena cava is normal in size with greater than 50% respiratory variability, suggesting right atrial pressure of 3 mmHg. IAS/Shunts: No atrial level shunt detected by color flow Doppler. Agitated saline contrast was given intravenously to evaluate for intracardiac shunting. Agitated saline contrast bubble study was negative, with no evidence of any interatrial shunt.  LEFT VENTRICLE PLAX 2D LVIDd:         3.80 cm     Diastology LVIDs:         2.20 cm     LV e' medial:    5.66 cm/s LV PW:         1.10 cm     LV E/e' medial:  14.6 LV IVS:        1.20 cm     LV e' lateral:   9.36 cm/s LVOT diam:     1.90 cm     LV E/e' lateral: 8.9 LV SV:         71 LV SV Index:   42 LVOT Area:     2.84 cm  LV Volumes (MOD) LV vol d, MOD A2C: 36.9 ml LV vol d, MOD A4C: 41.3 ml LV vol s, MOD A2C: 9.6 ml LV vol s, MOD A4C: 16.2 ml LV  SV MOD A2C:     27.3 ml LV SV MOD A4C:     41.3 ml LV SV MOD BP:      29.3 ml RIGHT VENTRICLE             IVC RV S prime:     10.80 cm/s  IVC diam: 1.60 cm TAPSE (M-mode): 2.2 cm LEFT ATRIUM             Index       RIGHT ATRIUM          Index LA diam:        3.30 cm 1.95 cm/m  RA Area:     9.96 cm LA Vol (A2C):   37.5 ml 22.13 ml/m RA Volume:   20.80 ml 12.27 ml/m LA Vol (A4C):   21.0 ml 12.39 ml/m LA Biplane Vol: 29.0 ml 17.11 ml/m  AORTIC VALVE LVOT Vmax:   127.00 cm/s LVOT Vmean:  80.800 cm/s LVOT VTI:    0.252 m  AORTA Ao Root diam: 3.00 cm MITRAL VALVE MV Area (PHT): 2.66 cm    SHUNTS MV Decel Time: 285 msec    Systemic VTI:  0.25 m MV E velocity: 82.90 cm/s  Systemic Diam: 1.90 cm MV A velocity: 94.60 cm/s MV E/A ratio:  0.88 Skeet Latch MD Electronically signed by Skeet Latch MD Signature Date/Time: 03/08/2020/12:12:14 PM    Final     Assessment: 59 y.o. female with early subacute left pontine ischemic infarction.  1. Exam reveals mild right sided motor deficits.  2. MRI brain: Acute/early subacute left pontine infarct with mild associated edema. No substantial mass effect. 3. MRA head: Loss of flow-related signal in the proximal left intradural V4 vertebral artery is concerning for occlusion or high-grade stenosis proximally of the left vertebral artery. There is evidence of likely retrograde opacification of the distal left vertebral artery and PICA.  4. MRA neck: The left vertebral artery is diminutive throughout its course with loss of flow related signal in the upper neck, compatible with high-grade stenosis or occlusion. Reconstitution of signal in the distal left V4 vertebral artery and PICA likely is from retrograde flow. Evaluation of the remaining vasculature in the neck is limited by patient motion. 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 5. TTE: Left ventricular ejection fraction, by estimation, is 60 to 65%. The left ventricle has normal  function. The left ventricle has no regional wall motion abnormalities. There is mild concentric left ventricular hypertrophy. Left ventricular diastolic parameters are consistent with Grade I diastolic dysfunction (impaired relaxation).  Right ventricular systolic function is normal. The right ventricular size is normal.  The mitral valve is normal in structure. Trivial mitral valve regurgitation. No evidence of mitral stenosis. Thickening of the non-coronary cusp on short axis views. The aortic valve is not well-visualized on three chamber view. Given stroke-like symptoms, would consider TEE to better evaluate. The aortic valve is tricuspid. Aortic valve regurgitation is not visualized. No aortic stenosis is present.  The inferior vena cava is normal in size with greater than 50% respiratory variability, suggesting right atrial pressure of 3 mmHg. Agitated saline contrast bubble study was negative, with no evidence of any interatrial shunt. 6. Stroke Risk Factors - DM, HTN and current smoker  Recommendations: 1. HgbA1c, fasting lipid panel 2. Cardiac telemetry 3. PT consult, OT consult, Speech consult 4. Prophylactic therapy- Agree with starting ASA and rosuvastatin 5. Risk factor modification - smoking cessation counseling is recommended. Improved home glycemic control also recommended.  6. Frequent neuro checks 7. BP management. Out of the permissive HTN time window.  8. Outpatient Neurology follow up 9. Most likely can be discharged without transfer to [facility]. Stroke work up is complete.   @Electronically  signed: Dr. Kerney Elbe  03/09/2020, 2:22 AM

## 2020-03-09 NOTE — Evaluation (Signed)
Physical Therapy Evaluation Patient Details Name: Peggy Ryan MRN: [id number] DOB: 03-24-1961 Today's Date: 03/09/2020   History of Present Illness  59 y.o. female with a PMHx of DM and HTN, current smoker, who presented on Saturday evening with a 3 day history of right sided numbness, impaired coordination and speech difficulty. 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   Clinical Impression  Patient presents with decreased mobility due to R sided weakness, imbalance, decreased safety awareness after 3 falls in 2 days and continued limitations getting up on her own in room while PT obtaining a gown.  Educated on need for RW, sturdy footwear, support/assistance from family, good lighting and clear pathways in the home.  Noted OT recommended 3:1 for use in shower and will need RW for home as well.  PT to follow acutely.  Agree with outpatient PT follow up at d/c.     Follow Up Recommendations Outpatient PT    Equipment Recommendations  Rolling walker with 5" wheels    Recommendations for Other Services       Precautions / Restrictions Precautions Precautions: Fall      Mobility  Bed Mobility Overal bed mobility: Independent                Transfers Overall transfer level: Needs assistance Equipment used: None Transfers: Sit to/from Stand Sit to Stand: Supervision Stand pivot transfers: Min assist       General transfer comment: unsteady on her feet without UE support  Ambulation/Gait Ambulation/Gait assistance: Min assist;Supervision;Min guard Gait Distance (Feet): 300 Feet Assistive device: None;4-wheeled walker Gait Pattern/deviations: Step-through pattern;Drifts right/left;Scissoring;Staggering right     General Gait Details: cross steps to regain balance, min A without device, S to minguard with RW; cues for keeping on straight path with RW  Stairs            Wheelchair Mobility    Modified Rankin (Stroke Patients  Only) Modified Rankin (Stroke Patients Only) Pre-Morbid Rankin Score: No symptoms Modified Rankin: Moderately severe disability     Balance Overall balance assessment: Needs assistance   Sitting balance-Leahy Scale: Good       Standing balance-Leahy Scale: Fair Standing balance comment: standing static without UE support; but dynamic balance needs UE support                 Standardized Balance Assessment Standardized Balance Assessment : Dynamic Gait Index   Dynamic Gait Index Level Surface: Moderate Impairment Change in Gait Speed: Mild Impairment Gait with Horizontal Head Turns: Moderate Impairment Gait with Vertical Head Turns: Moderate Impairment Gait and Pivot Turn: Severe Impairment       Pertinent Vitals/Pain Pain Assessment: No/denies pain    Home Living Family/patient expects to be discharged to:: Private residence Living Arrangements: Spouse/significant other Available Help at Discharge: Friend(s);Available PRN/intermittently Type of Home: Other(Comment) (condo) Home Access: Level entry     Home Layout: One level Home Equipment: Cane - single point      Prior Function Level of Independence: Independent         Comments: occasionally uses cane when sciatica bothers her; 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; does "nothing much" has [employer]   Dominant Hand: Right    Extremity/Trunk Assessment   Upper Extremity Assessment Upper Extremity Assessment: Defer to OT evaluation RUE Deficits / Details: AROM overall WFL; difficulty with in-hand manipulation skills; "clumsy- hand" using her hand functionally to text  which she states is "better" RUE Coordination: decreased fine motor    Lower Extremity Assessment Lower Extremity Assessment: RLE deficits/detail RLE Deficits / Details: AROM WFL, strength hip flexion 3+/5, knee extension 4-/5, ankle DF 4/5; gross coordination intact to toe taps and heel  to shin RLE Sensation:  (intact to light touch)    Cervical / Trunk Assessment Cervical / Trunk Assessment: Other exceptions (R bias)  Communication   Communication: Expressive difficulties  Cognition Arousal/Alertness: Awake/alert Behavior During Therapy: WFL for tasks assessed/performed Overall Cognitive Status: Impaired/Different from baseline Area of Impairment: Attention;Safety/judgement;Awareness;Memory                   Current Attention Level: Selective Memory: Decreased short-term memory   Safety/Judgement: Decreased awareness of safety;Decreased awareness of deficits Awareness: Emergent   General Comments: Unable to serial substract - states she couldn't do this at baseline; difficulty with delayed recall  - remembered 2/3 items; staets "I was little confused"      General Comments General comments (skin integrity, edema, etc.): Educated on fall prevention including footwear (pt donned her shoes prior to ambulation), using RW for now, keeping night light on in bathroom and utilizing a seat in shower.    Exercises Other Exercises Other Exercises: in-hand manipulation skills Other Exercises: fine motor coordination   Assessment/Plan    PT Assessment Patient needs continued PT services  PT Problem List Decreased strength;Decreased mobility;Decreased balance;Decreased knowledge of use of DME;Decreased safety awareness       PT Treatment Interventions DME instruction;Therapeutic activities;Therapeutic exercise;Gait training;Balance training;Functional mobility training;Patient/family education    PT Goals (Current goals can be found in the Care Plan section)  Acute Rehab PT Goals Patient Stated Goal: to go home PT Goal Formulation: With patient Time For Goal Achievement: 03/16/20 Potential to Achieve Goals: Good    Frequency Min 4X/week   Barriers to discharge        Co-evaluation               AM-PAC PT "6 Clicks" Mobility  Outcome Measure  Help needed turning from your back to your side while in a flat bed without using bedrails?: None Help needed moving from lying on your back to sitting on the side of a flat bed without using bedrails?: None Help needed moving to and from a bed to a chair (including a wheelchair)?: A Little Help needed standing up from a chair using your arms (e.g., wheelchair or bedside chair)?: None Help needed to walk in hospital room?: A Little Help needed climbing 3-5 steps with a railing? : A Little 6 Click Score: 21    End of Session   Activity Tolerance: Patient tolerated treatment well Patient left: with call bell/phone within reach;in bed;with bed alarm set   PT Visit Diagnosis: Other abnormalities of gait and mobility (R26.89);Other symptoms and signs involving the nervous system (R29.898)    Time: 4599-7741 PT Time Calculation (min) (ACUTE ONLY): 18 min   Charges:   PT Evaluation $PT Eval Moderate Complexity: 1 Mod          Cyndi Huzaifa Viney, PT Acute Rehabilitation Services SELTR:[phone] Office:[phone] 03/09/2020   Reginia Naas 03/09/2020, 4:46 PM

## 2020-03-09 NOTE — Progress Notes (Signed)
PT Cancellation Note  Patient Details Name: Peggy Ryan MRN: [id number] DOB: [birth year]   Cancelled Treatment:    Reason Eval/Treat Not Completed: Other (comment) (transferring to [facility] [place], transport on the way)   Damany Eastman,KATHrine E 03/09/2020, 11:48 AM Jannette Spanner PT, DPT Acute Rehabilitation Services Pager: [phone] Office: [phone]

## 2020-03-10 NOTE — Discharge Summary (Signed)
[facility] Discharge Summary   Patient: Peggy Ryan HGD:[id number]  PCP: Shon Baton, MD  Date of admission: 03/07/2020   Date of discharge: 03/09/2020      Discharge Diagnoses:  Principal Problem:   Dysarthria Active Problems:   Weakness of right upper extremity   Essential hypertension   Major depressive disorder   Panic disorder   Type 2 diabetes mellitus with complication, without long-term current use of insulin (HCC)   Mixed diabetic hyperlipidemia associated with type 2 diabetes mellitus ([place])   CVA (cerebral vascular accident) ([place])  Admitted From: home Disposition:  Home   Recommendations for Outpatient Follow-up:  1. PCP: please follow up with PCP in 1 week 2. Follow up LABS/TEST:  none   Follow-up Information    Shon Baton, MD. Schedule an appointment as soon as possible for a visit in 1 week(s).   Specialty: Internal Medicine Contact information: [place] [address] [phone]        Garvin Fila, MD. Schedule an appointment as soon as possible for a visit in 2 month(s).   Specialties: Neurology, Radiology Contact information: [address] [place] [address] [place] Follow up.   Specialty: Rehabilitation Contact information: [place] [id number] [address]c [address] [place] [phone]             Diet recommendation: Cardiac diet  Activity: The patient is advised to gradually reintroduce usual activities, as tolerated  Discharge Condition: stable  Code Status: Full code   History of present illness: As per the H and P dictated on admission, "59 year old female with past medical history of diabetes mellitus type 2, hypertension, panic disorder, major depressive disorder, nicotine dependence who presents to [facility] emergency department with multiple neurologic complaints.   Patient explains  that on Wednesday morning she woke from sleep and while walking to the bathroom noticed that she had difficulty with her balance.  In the next several hours the patient noticed that she was slurring her words.  She also noticed that her right upper extremity was weak and she had difficulty using her right hand to the point where she could no longer text on her phone or write.    Patient denies any associated chest pain, shortness of breath, palpitations.  Patient denies any sensory changes.  Patient denies any visual changes.   Patient symptoms persisted for over 3 days until she eventually decided to present to [place] emergency department for evaluation.  Upon evaluation in the emergency department due to patient's observed neurologic symptoms CT imaging of the head was performed and was found to be negative.  Overnight telemetry neurologist did evaluate the patient and recommended hospitalization for stroke work-up.  Case was then discussed with Dr. Cheral Marker with neurology who recommended transfer to [facility] for continued work-up.  Patient was administered 325 mg of aspirin.  The hospitalist group was then called to assess the patient for admission to the hospital."  Hospital Course:  Summary of her active problems in the hospital is as following.  Acute Pontine CVA. Per neurology likely lacunar infarct.  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 Patient has ataxia and now improving  Patient has already passed a swallow eval therefore will be able to eat. PT consult, OT consult, recommended outpatient PT Speech consult no need for therapy.  ASA and rosuvastatin Continue  home oral hypoglycemic  Type 2 diabetes mellitus uncontrolled with hyperglycemia with hyperlipidemia. Blood sugars elevated. Sliding scale insulin. Carb modified diet.  Severe hypertriglyceridemia along with hyperlipidemia. Platelets are significantly elevated although patient says  it generally stays in thousands. Cholesterol and LDL also elevated.  Patient tells me that she has problems with her lipid panel since she was in 43s. She also mentions that she has tried all types of steroids but they generally "zap her out of energy". Currently willing to try Crestor. Explained that she may also be a candidate for Repatha injections if this does not work.  Chest pain-noncardiac. Troponins are negative. Echocardiogram shows preserved EF. No WMA or valvular abnormality.   Patient was seen by physical therapy, who recommended Home health,  was arranged. On the day of the discharge the patient's vitals were stable, and no other acute medical condition were reported by patient. The patient was felt safe to be discharge at Home with Home health.  Consultants: neurology Procedures: Echocardiogram   Discharge Exam: General: Appear in no distress, no Rash; Oral Mucosa Clear, moist. no Abnormal Neck Mass Or lumps, Conjunctiva normal  Cardiovascular: S1 and S2 Present, no Murmur Respiratory: good respiratory effort, Bilateral Air entry present and CTA, no Crackles, no wheezes Abdomen: Bowel Sound present, Soft and no tenderness Extremities: no Pedal edema Neurology: alert and oriented to time, place, and person affect appropriate. no new focal deficit, mild dysmetria  Filed Weights   03/07/20 2152  Weight: 70.3 kg   Vitals:   03/09/20 1250 03/09/20 1629  BP: 123/65 121/70  Pulse: 78 76  Resp: 16 16  Temp: 98.2 F (36.8 C) (!) 97.4 F (36.3 C)  SpO2: 98% 97%   DISCHARGE MEDICATION: Allergies as of 03/09/2020      Reactions   Contrast Media [iodinated Diagnostic Agents] Hives, Itching   Levofloxacin Anaphylaxis   Latex Rash   Sulfa Antibiotics Rash      Medication List    TAKE these medications   aspirin EC 81 MG tablet Take 1 tablet (81 mg total) by mouth daily. Swallow whole.   buPROPion 75 MG tablet Commonly known as: WELLBUTRIN Take 75 mg by mouth  daily.   citalopram 40 MG tablet Commonly known as: CELEXA Take 40 mg by mouth daily.   fenofibrate 145 MG tablet Commonly known as: TRICOR Take 145 mg by mouth daily.   glimepiride 1 MG tablet Commonly known as: AMARYL Take 2 mg by mouth in the morning and at bedtime.   hydrochlorothiazide 25 MG tablet Commonly known as: HYDRODIURIL Take 12.5 mg by mouth daily.   lisinopril 10 MG tablet Commonly known as: ZESTRIL Take 10 mg by mouth daily.   LORazepam 0.5 MG tablet Commonly known as: ATIVAN Take 0.25-0.5 mg by mouth 4 (four) times daily.   metFORMIN 500 MG tablet Commonly known as: GLUCOPHAGE Take 1 tablet (500 mg total) by mouth 2 (two) times daily with a meal.   nicotine 14 mg/24hr patch Commonly known as: NICODERM CQ - dosed in mg/24 hours Place 1 patch (14 mg total) onto the skin daily.   rosuvastatin 40 MG tablet Commonly known as: CRESTOR Take 1 tablet (40 mg total) by mouth daily at 6 PM.      Allergies  Allergen Reactions  . Contrast Media [Iodinated Diagnostic Agents] Hives and Itching  . Levofloxacin Anaphylaxis  . Latex Rash  . Sulfa Antibiotics Rash   Discharge Instructions    Ambulatory referral to Neurology   Complete  by: As directed    An appointment is requested in approximately: 8 weeks   Ambulatory referral to Occupational Therapy   Complete by: As directed    Ambulatory referral to Physical Therapy   Complete by: As directed    Ambulatory referral to Physical Therapy   Complete by: As directed    Iontophoresis - 4 mg/ml of dexamethasone: No   T.E.N.S. Unit Evaluation and Dispense as Indicated: No   Diet - low sodium heart healthy   Complete by: As directed    Diet Carb Modified   Complete by: As directed    Increase activity slowly   Complete by: As directed       The results of significant diagnostics from this hospitalization (including imaging, microbiology, ancillary and laboratory) are listed below for reference.     Significant Diagnostic Studies: DG Chest 2 View  Result Date: 03/07/2020 CLINICAL DATA:  Chest pain.  Possible stroke 3 days ago. EXAM: CHEST - 2 VIEW COMPARISON:  None. FINDINGS: The heart size and mediastinal contours are within normal limits. Both lungs are clear. The visualized skeletal structures are unremarkable. IMPRESSION: No active cardiopulmonary disease. Electronically Signed   By: Lucienne Capers M.D.   On: 03/07/2020 22:43   CT Head Wo Contrast  Result Date: 03/08/2020 CLINICAL DATA:  Altered sensation of the right side of face, concern for stroke EXAM: CT HEAD WITHOUT CONTRAST TECHNIQUE: Contiguous axial images were obtained from the base of the skull through the vertex without intravenous contrast. COMPARISON:  None. FINDINGS: Brain: No evidence of acute infarction, hemorrhage, hydrocephalus, extra-axial collection, visible mass lesion or mass effect. Vascular: Atherosclerotic calcification of the carotid siphons and right intradural vertebral artery. No hyperdense vessel. Skull: No calvarial fracture or suspicious osseous lesion. No scalp swelling or hematoma. Sinuses/Orbits: Paranasal sinuses and mastoid air cells are predominantly clear. Included orbital structures are unremarkable. Other: None IMPRESSION: No acute intracranial abnormality. If there is persisting clinical concern for infarct, MRI is more sensitive and specific for early changes of ischemia. Intracranial atherosclerosis. Electronically Signed   By: Lovena Le M.D.   On: 03/08/2020 01:29   MR ANGIO HEAD WO CONTRAST  Result Date: 03/08/2020 CLINICAL DATA:  Stroke suspected. Neuro deficit. Right-sided facial numbness. EXAM: MRA HEAD WITHOUT CONTRAST TECHNIQUE: Angiographic images of the Circle of Willis were obtained using MRA technique without intravenous contrast. COMPARISON:  None. FINDINGS: Evaluation is limited by patient motion. Loss of flow-related signal in the proximal intradural V4 left vertebral artery is  concerning for occlusion or high-grade stenosis. There is evidence of likely retrograde opacification of the distal left vertebral artery and PICA. No significant (greater than 50%) stenosis or occlusion of the right vertebral artery, the basilar artery, the anterior circulation, or posterior circulation. Try for CT a Ca, anatomic variant. No aneurysm identified; however, patient motion limits evaluation. IMPRESSION: Motion limited examination. 1. With loss of flow-related signal in the proximal left intradural V4 vertebral artery is concerning for occlusion or high-grade stenosis proximally of the left vertebral artery. There is evidence of likely retrograde opacification of the distal left vertebral artery and PICA. 2. Otherwise, no evidence of significant arterial stenosis or occlusion intracranially. Electronically Signed   By: Margaretha Sheffield MD   On: 03/08/2020 11:26   MR ANGIO NECK WO CONTRAST  Result Date: 03/08/2020 CLINICAL DATA:  Right-sided facial this.  Stroke suspected. EXAM: MRA NECK WITHOUT CONTRAST TECHNIQUE: Angiographic images of the neck were obtained using MRA technique without intravenous contrast. Carotid stenosis  measurements (when applicable) are obtained utilizing NASCET criteria, using the distal internal carotid diameter as the denominator. COMPARISON:  None. FINDINGS: Evaluation is limited by patient motion. The left vertebral artery is diminutive throughout its course with loss of flow related signal in the upper neck. Reconstitution of signal in the distal left V4 vertebral artery may be from retrograde flow. Evaluation of the remaining vasculature in the neck is limited by patient motion. Likely approximately 50% stenosis of the proximal right ICA. Mild irregularity of the right vertebral artery without evidence of greater than 50% narrowing. IMPRESSION: 1. The left vertebral artery is diminutive throughout its course with loss of flow related signal in the upper neck,  compatible with high-grade stenosis or occlusion. Reconstitution of signal in the distal left V4 vertebral artery and PICA likely is from retrograde flow. 2. Evaluation of the remaining vasculature in the neck is limited by patient motion. 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 Consider CTA of the neck to better evaluate. Electronically Signed   By: Margaretha Sheffield MD   On: 03/08/2020 11:35   MR BRAIN WO CONTRAST  Result Date: 03/08/2020 CLINICAL DATA:  Stroke workup.  Left-sided facial numbness. EXAM: MRI HEAD WITHOUT CONTRAST TECHNIQUE: Multiplanar, multiecho pulse sequences of the brain and surrounding structures were obtained without intravenous contrast. COMPARISON:  CT head 03/08/2020 FINDINGS: Brain: Restricted infusion involving the left pons, compatible with acute or early subacute infarct. Mild associated edema. No substantial mass effect. No acute hemorrhage. No hydrocephalus. Mild (age-appropriate) scattered T2/FLAIR hyperintensities within the white matter, compatible with the sequela of chronic microvascular ischemic disease. Vascular: Further characterized on concurrent MRA. Skull and upper cervical spine: Limited evaluation given motion. No evidence of acute abnormality. Sinuses/Orbits: Negative. Other: No mastoid effusions. IMPRESSION: Acute/early subacute left pontine infarct with mild associated edema. No substantial mass effect. No hydrocephalus. Electronically Signed   By: Margaretha Sheffield MD   On: 03/08/2020 11:18   ECHOCARDIOGRAM COMPLETE BUBBLE STUDY  Result Date: 03/08/2020    ECHOCARDIOGRAM REPORT   Patient Name:   Peggy Ryan Date of Exam: 03/08/2020 Medical Rec #:  [id number]       Height:       61.0 in Accession #:    [id number]      Weight:       155.0 lb Date of Birth:  11-22-1960      BSA:          1.695 m Patient Age:    60 years        BP:           120/56 mmHg Patient Gender: F               HR:           78 bpm. Exam Location:  Inpatient  Procedure: 2D Echo, Cardiac Doppler and Color Doppler Indications:    Stroke.  History:        Patient has no prior history of Echocardiogram examinations.                 Risk Factors:Hypertension, Diabetes and Dyslipidemia.  Sonographer:    Roseanna Rainbow RDCS Referring Phys: [id number] [place]  Sonographer Comments: Patient moving throughout exam. IMPRESSIONS  1. Left ventricular ejection fraction, by estimation, is 60 to 65%. The left ventricle has normal function. The left ventricle has no regional wall motion abnormalities. There is mild concentric left ventricular hypertrophy. Left ventricular diastolic parameters are consistent with  Grade I diastolic dysfunction (impaired relaxation).  2. Right ventricular systolic function is normal. The right ventricular size is normal.  3. The mitral valve is normal in structure. Trivial mitral valve regurgitation. No evidence of mitral stenosis.  4. Thickening of the non-coronary cusp on short axis views. The aortic valve is not well-visualized on three chamber view. Given stroke-like symptoms, would consider TEE to better evaluate. The aortic valve is tricuspid. Aortic valve regurgitation is not visualized. No aortic stenosis is present.  5. The inferior vena cava is normal in size with greater than 50% respiratory variability, suggesting right atrial pressure of 3 mmHg.  6. Agitated saline contrast bubble study was negative, with no evidence of any interatrial shunt. FINDINGS  Left Ventricle: Left ventricular ejection fraction, by estimation, is 60 to 65%. The left ventricle has normal function. The left ventricle has no regional wall motion abnormalities. The left ventricular internal cavity size was normal in size. There is  mild concentric left ventricular hypertrophy. Left ventricular diastolic parameters are consistent with Grade I diastolic dysfunction (impaired relaxation). Indeterminate filling pressures. Right Ventricle: The right ventricular size is normal.  No increase in right ventricular wall thickness. Right ventricular systolic function is normal. Left Atrium: Left atrial size was normal in size. Right Atrium: Right atrial size was normal in size. Pericardium: There is no evidence of pericardial effusion. Mitral Valve: The mitral valve is normal in structure. Trivial mitral valve regurgitation. No evidence of mitral valve stenosis. Tricuspid Valve: The tricuspid valve is normal in structure. Tricuspid valve regurgitation is trivial. No evidence of tricuspid stenosis. Aortic Valve: Thickening of the non-coronary cusp on short axis views. The aortic valve is not well-visualized on three chamber view. Given stroke-like symptoms, would consider TEE to better evaluate. The aortic valve is tricuspid. Aortic valve regurgitation is not visualized. No aortic stenosis is present. Pulmonic Valve: The pulmonic valve was normal in structure. Pulmonic valve regurgitation is not visualized. No evidence of pulmonic stenosis. Aorta: The aortic root is normal in size and structure. Venous: The inferior vena cava is normal in size with greater than 50% respiratory variability, suggesting right atrial pressure of 3 mmHg. IAS/Shunts: No atrial level shunt detected by color flow Doppler. Agitated saline contrast was given intravenously to evaluate for intracardiac shunting. Agitated saline contrast bubble study was negative, with no evidence of any interatrial shunt.  LEFT VENTRICLE PLAX 2D LVIDd:         3.80 cm     Diastology LVIDs:         2.20 cm     LV e' medial:    5.66 cm/s LV PW:         1.10 cm     LV E/e' medial:  14.6 LV IVS:        1.20 cm     LV e' lateral:   9.36 cm/s LVOT diam:     1.90 cm     LV E/e' lateral: 8.9 LV SV:         71 LV SV Index:   42 LVOT Area:     2.84 cm  LV Volumes (MOD) LV vol d, MOD A2C: 36.9 ml LV vol d, MOD A4C: 41.3 ml LV vol s, MOD A2C: 9.6 ml LV vol s, MOD A4C: 16.2 ml LV SV MOD A2C:     27.3 ml LV SV MOD A4C:     41.3 ml LV SV MOD BP:      29.3  ml RIGHT VENTRICLE  IVC RV S prime:     10.80 cm/s  IVC diam: 1.60 cm TAPSE (M-mode): 2.2 cm LEFT ATRIUM             Index       RIGHT ATRIUM          Index LA diam:        3.30 cm 1.95 cm/m  RA Area:     9.96 cm LA Vol (A2C):   37.5 ml 22.13 ml/m RA Volume:   20.80 ml 12.27 ml/m LA Vol (A4C):   21.0 ml 12.39 ml/m LA Biplane Vol: 29.0 ml 17.11 ml/m  AORTIC VALVE LVOT Vmax:   127.00 cm/s LVOT Vmean:  80.800 cm/s LVOT VTI:    0.252 m  AORTA Ao Root diam: 3.00 cm MITRAL VALVE MV Area (PHT): 2.66 cm    SHUNTS MV Decel Time: 285 msec    Systemic VTI:  0.25 m MV E velocity: 82.90 cm/s  Systemic Diam: 1.90 cm MV A velocity: 94.60 cm/s MV E/A ratio:  0.88 Skeet Latch MD Electronically signed by Skeet Latch MD Signature Date/Time: 03/08/2020/12:12:14 PM    Final    Microbiology: Recent Results (from the past 240 hour(s))  Respiratory Panel by RT PCR (Flu A&B, Covid) - Urine, Clean Catch     Status: None   Collection Time: 03/07/20 11:57 PM   Specimen: Urine, Clean Catch; Nasopharyngeal  Result Value Ref Range Status   SARS Coronavirus 2 by RT PCR NEGATIVE NEGATIVE Final    Comment: (NOTE) SARS-CoV-2 target nucleic acids are NOT DETECTED.  The SARS-CoV-2 RNA is generally detectable in upper respiratoy specimens during the acute phase of infection. The lowest concentration of SARS-CoV-2 viral copies this assay can detect is 131 copies/mL. A negative result does not preclude SARS-Cov-2 infection and should not be used as the sole basis for treatment or other patient management decisions. A negative result may occur with  improper specimen collection/handling, submission of specimen other than nasopharyngeal swab, presence of viral mutation(s) within the areas targeted by this assay, and inadequate number of viral copies (<131 copies/mL). A negative result must be combined with clinical observations, patient history, and epidemiological information. The expected result is  Negative.  Fact Sheet for Patients:  PinkCheek.be  Fact Sheet for Healthcare Providers:  GravelBags.it  This test is no t yet approved or cleared by the [place] FDA and  has been authorized for detection and/or diagnosis of SARS-CoV-2 by FDA under an Emergency Use Authorization (EUA). This EUA will remain  in effect (meaning this test can be used) for the duration of the COVID-19 declaration under Section 564(b)(1) of the Act, 21 U.S.C. section 360bbb-3(b)(1), unless the authorization is terminated or revoked sooner.     Influenza A by PCR NEGATIVE NEGATIVE Final   Influenza B by PCR NEGATIVE NEGATIVE Final    Comment: (NOTE) The Xpert Xpress SARS-CoV-2/FLU/RSV assay is intended as an aid in  the diagnosis of influenza from Nasopharyngeal swab specimens and  should not be used as a sole basis for treatment. Nasal washings and  aspirates are unacceptable for Xpert Xpress SARS-CoV-2/FLU/RSV  testing.  Fact Sheet for Patients: PinkCheek.be  Fact Sheet for Healthcare Providers: GravelBags.it  This test is not yet approved or cleared by the [place] FDA and  has been authorized for detection and/or diagnosis of SARS-CoV-2 by  FDA under an Emergency Use Authorization (EUA). This EUA will remain  in effect (meaning this test can be used) for the duration  of the  Covid-19 declaration under Section 564(b)(1) of the Act, 21  U.S.C. section 360bbb-3(b)(1), unless the authorization is  terminated or revoked. Performed at [facility] [place] [address] [place], [place] [address]    Labs: CBC: Recent Labs  Lab 03/07/20 2230 03/07/20 2357 03/08/20 0659 03/09/20 0354  WBC 9.2  --  9.0 8.1  NEUTROABS  --  4.7  --  3.2  HGB 14.4  --  14.1 13.5  HCT 40.7  --  39.2 38.0  MCV 88.9  --  88.5 89.2  PLT 230  --  223 827   Basic Metabolic  Panel: Recent Labs  Lab 03/07/20 2230 03/08/20 0659 03/09/20 0354  NA 132* 134* 137  K 4.1 3.8 4.2  CL 94* 97* 99  CO2 25 25 28   GLUCOSE 199* 209* 186*  BUN 18 14 14   CREATININE 0.66 0.55 0.55  CALCIUM 10.1 9.5 9.4  MG  --   --  1.9   Liver Function Tests: Recent Labs  Lab 03/08/20 0659 03/09/20 0354  AST 25 21  ALT 42 37  ALKPHOS 40 38  BILITOT 0.6 0.6  PROT 7.1 6.7  ALBUMIN 4.2 4.1   CBG: Recent Labs  Lab 03/08/20 1747 03/08/20 2358 03/09/20 0917 03/09/20 1243 03/09/20 1632  GLUCAP 310* 174* 225* 240* 103*    Time spent: 35 minutes  Signed:  Berle Mull  [facility] 03/09/2020

## 2020-03-16 ENCOUNTER — Other Ambulatory Visit: Payer: Self-pay | Admitting: *Deleted

## 2020-03-16 NOTE — Patient Outreach (Signed)
[name]  03/16/2020  Peggy Ryan [birth year] [id number]   RED ON EMMI ALERT - Stroke Day # 3 Date: 10/2 Red Alert Reason: Feeling worse overall   Outreach attempt #1, unsuccessful, HIPAA compliant voice message left.   Plan: RN CM will send unsuccessful outreach letter and follow up within the next 3-4 business days.  Valente David, [place], MSN [place] [phone]

## 2020-03-19 ENCOUNTER — Other Ambulatory Visit: Payer: Self-pay | Admitting: *Deleted

## 2020-03-19 NOTE — Patient Outreach (Signed)
[place] [facility]) Care Management  03/19/2020  Peggy Ryan Dec 06, 1960 [id number]   RED ON EMMI ALERT - Stroke Day # 3 Date: 10/2 Red Alert Reason: Feeling worse overall   Outreach attempt #2, unsuccessful, HIPAA compliant voice message left.   Plan: RN CM will follow up within the next 3-4 business days.  Valente David, [place], MSN [place] [phone]

## 2020-03-24 ENCOUNTER — Ambulatory Visit: Payer: Medicare Other | Attending: Internal Medicine | Admitting: Physical Therapy

## 2020-03-24 ENCOUNTER — Other Ambulatory Visit: Payer: Self-pay

## 2020-03-24 ENCOUNTER — Encounter: Payer: Self-pay | Admitting: Physical Therapy

## 2020-03-24 DIAGNOSIS — M6281 Muscle weakness (generalized): Secondary | ICD-10-CM | POA: Insufficient documentation

## 2020-03-24 DIAGNOSIS — R2681 Unsteadiness on feet: Secondary | ICD-10-CM | POA: Diagnosis not present

## 2020-03-24 DIAGNOSIS — R296 Repeated falls: Secondary | ICD-10-CM | POA: Insufficient documentation

## 2020-03-24 DIAGNOSIS — R2689 Other abnormalities of gait and mobility: Secondary | ICD-10-CM | POA: Insufficient documentation

## 2020-03-24 NOTE — Therapy (Signed)
[facility] [address]  [place], [place], [address] Phone: [phone]   Fax:  [phone]  Physical Therapy Evaluation  Patient Details  Name: Peggy Ryan MRN: [id number] Date of Birth: May 01, 1961 Referring Provider (PT): Berle Mull, MD   Encounter Date: 03/24/2020   PT End of Session - 03/24/20 1547    Visit Number 1    Number of Visits 13    Date for PT Re-Evaluation 05/05/20    Authorization Type [employer]    PT Start Time 1459   pt late   PT Stop Time 1538    PT Time Calculation (min) 39 min    Activity Tolerance Patient tolerated treatment well    Behavior During Therapy [facility] for tasks assessed/performed           Past Medical History:  Diagnosis Date  . Anxiety   . Diabetes mellitus   . Panic attacks     Past Surgical History:  Procedure Laterality Date  . CARPAL TUNNEL RELEASE    . CHOLECYSTECTOMY    . TONSILLECTOMY      There were no vitals filed for this visit.    Subjective Assessment - 03/24/20 1500    Subjective Patient reports that she was hospitalized in late September for a stroke. Was supposed to be DC'd with a walker but "they forgot to put it in the car" so she has been using a cane instead. Has had 6 falls since D/C from hospital and admits to not using her AD each time. Denies hitting her head or other injuries. Having trouble with balance, dizziness, and weakness. Feels dizzy in the shower- especially when closing her eyes. Has a shower chair but does not use it. Also notes weakness in the R half of her body- "everything is challenging" including walking and talking. Notes that she sometimes she "gets choked" when she eats. Reports L arm tingling but feels that this may be d/t her neck or the way she slept.    Pertinent History panic attacks, DM, anxiety, carpal tunnel release    Limitations Lifting;Standing;Walking;House hold activities    Diagnostic tests 03/08/20  brain MRI: Acute/early subacute left pontine infarct with mild associated edema. No substantial mass effect. No hydrocephalus.    Patient Stated Goals *work on walking, getting less dizzy, and weakness"    Currently in Pain? No/denies   patient denies pain as reason for pursuing therapy             [facility] PT Assessment - 03/24/20 1509      Assessment   Medical Diagnosis CVA    Referring Provider (PT) Berle Mull, MD    Onset Date/Surgical Date 03/07/20    Hand Dominance Right    Prior Therapy yes      Precautions   Precautions Fall      Balance Screen   Has the patient fallen in the past 6 months Yes    How many times? 6    Has the patient had a decrease in activity level because of a fear of falling?  No    Is the patient reluctant to leave their home because of a fear of falling?  No      Home Environment   Living Environment Private residence    Living Arrangements Non-relatives/Friends    Available Help at Discharge --   not much social support locally   Type of [place]  Access Level entry    Home Layout Able to live on main level with bedroom/bathroom   split level home- lives on bottom floor   [place] - quad;Shower seat      Prior Function   Level of Independence Independent    Vocation On disability    Leisure working out      Charity fundraiser Status Within Functional Limits for tasks assessed   slight difficulty with word finding   Behaviors Impulsive      Observation/Other Assessments   Observations slight R sided facial droop      Sensation   Light Touch Appears Intact      Coordination   Gross Motor Movements are Fluid and Coordinated Yes      Posture/Postural Control   Posture/Postural Control Postural limitations    Postural Limitations Rounded Shoulders      ROM / Strength   AROM / PROM / Strength AROM;Strength      AROM   AROM Assessment Site Ankle    Right/Left Ankle Right;Left    Right Ankle  Dorsiflexion 7    Left Ankle Dorsiflexion 12      Strength   Overall Strength Comments performed in sitting    Strength Assessment Site Hip;Knee;Ankle;Shoulder;Elbow;Wrist;Hand    Right/Left Shoulder Right;Left    Right Shoulder Flexion 4/5    Right Shoulder ABduction 4/5    Right Shoulder Internal Rotation 4/5    Right Shoulder External Rotation 4/5    Left Shoulder Flexion 4+/5    Left Shoulder ABduction 4+/5    Left Shoulder Internal Rotation 4/5    Left Shoulder External Rotation 4+/5    Right/Left Elbow Right;Left    Right Elbow Flexion 4+/5    Right Elbow Extension 4+/5    Left Elbow Flexion 4+/5    Left Elbow Extension 4+/5    Right/Left Wrist Right;Left    Right Wrist Flexion 4+/5    Right Wrist Extension 4+/5    Left Wrist Flexion 4+/5    Left Wrist Extension 4+/5    Right/Left hand Right;Left    Right Hand Grip (lbs) 13.67   15, 15, 11   Left Hand Grip (lbs) 19.67   18, 21, 20   Right/Left Hip Right;Left    Right Hip Flexion 4+/5    Right Hip ABduction 4+/5    Right Hip ADduction 4/5    Left Hip Flexion 4+/5    Left Hip ABduction 4+/5    Left Hip ADduction 4/5    Right/Left Knee Right;Left    Right Knee Flexion 4+/5    Right Knee Extension 4/5    Left Knee Flexion 4+/5    Left Knee Extension 4+/5    Right/Left Ankle Right;Left    Right Ankle Dorsiflexion 4-/5    Right Ankle Plantar Flexion 3+/5    Left Ankle Dorsiflexion 4+/5    Left Ankle Plantar Flexion 4/5      Ambulation/Gait   Gait Pattern Step-through pattern;Decreased step length - right;Decreased step length - left;Lateral trunk lean to right;Lateral trunk lean to left   frequent path deviation   Ambulation Surface Level;Indoor    Gait velocity decreased                      Objective measurements completed on examination: See above findings.               PT Education - 03/24/20 1546    Education Details prognosis, POC,  HEP; heavily educated patient on need for  consistent AD and shower seat use for safety; educated on benefits of OT and ST s/p CVA    Person(s) Educated Patient    Methods Explanation;Demonstration;Tactile cues;Handout;Verbal cues    Comprehension Verbalized understanding;Returned demonstration            PT Short Term Goals - 03/24/20 1555      PT SHORT TERM GOAL #1   Title Patient to be independent with initial HEP.    Time 2    Period Weeks    Status New    Target Date 04/07/20             PT Long Term Goals - 03/24/20 1555      PT LONG TERM GOAL #1   Title Patient to be independent with advanced HEP.    Time 6    Period Weeks    Status New    Target Date 05/05/20      PT LONG TERM GOAL #2   Title Patient to demonstrate R UE strength >/=4+/5 and symmetrical grip strength.    Time 6    Period Weeks    Status New    Target Date 05/05/20      PT LONG TERM GOAL #3   Title Patient to demonstrate R LE strength >/=4+/5.    Time 6    Period Weeks    Status New    Target Date 05/05/20      PT LONG TERM GOAL #4   Title Patient to score >/=46/56 on Berg in order to demonstrate a decreased risk of falls.    Time 6    Period Weeks    Status New    Target Date 05/05/20      PT LONG TERM GOAL #5   Title Patient to score >19/24 on DGI in order to demonstrate a decreased risk of falls.    Time 6    Period Weeks    Status New    Target Date 05/05/20      Additional Long Term Goals   Additional Long Term Goals Yes      PT LONG TERM GOAL #6   Title Patient to demonstrate R ankle dorsiflexion AROM aleast 10 degrees.    Time 6    Period Weeks    Status New    Target Date 05/05/20      PT LONG TERM GOAL #7   Title Patient to report 75% improvement in dizziness with ADLs.    Time 6    Period Weeks    Status New    Target Date 05/05/20                  Plan - 03/24/20 1548    Clinical Impression Statement Patient is a 58y/o F presenting to OPPT with c/o R sided hemibody weakness, imbalance,  and dizziness s/p CVA on 03/07/20. Patient admits to 6 falls since D/C from hospital on 03/09/20; all of which occurred without use of AD. Patient reports feeling dizzy when closing her eyes in the shower but admits to not using her shower seat. Also noting trouble speaking and swallowing. Patient today presenting with slight R sided facial droop, rounded shoulders, limited R ankle dorsiflexion AROM, R shoulder and hand weakness, R ankle weakness, and gait deviations. Assessment today limited d/t time constraints. Patient was heavily educated on importance of cane and shower seat use to avoid future falls. Also educated on HEP and recommendation for  OT and ST to address hand weakness, speech, and swallowing difficulties- likely would find benefit from attending [facility]. Patient would benefit from skilled PT services 2x/week for 6 weeks to address aforementioned impairments.    Personal Factors and Comorbidities Age;Comorbidity 3+;Fitness;Past/Current Experience;Time since onset of injury/illness/exacerbation;Transportation;Behavior Pattern    Comorbidities panic attacks, DM, anxiety, carpal tunnel release    Examination-Activity Limitations Bathing;Bend;Stairs;Carry;Stand;Dressing;Transfers;Hygiene/Grooming;Lift;Locomotion Level;Reach Overhead    Examination-Participation Restrictions Church;Cleaning;Community Activity;Shop;Driving;Laundry;Meal Prep    Stability/Clinical Decision Making Evolving/Moderate complexity    Clinical Decision Making Moderate    Rehab Potential Good    PT Frequency 2x / week    PT Duration 6 weeks    PT Treatment/Interventions ADLs/Self Care Home Management;Canalith Repostioning;Cryotherapy;Electrical Stimulation;Moist Heat;Balance training;Therapeutic exercise;Therapeutic activities;Functional mobility training;Stair training;Gait training;DME Instruction;Ultrasound;Neuromuscular re-education;Patient/family education;Manual techniques;Vestibular;Taping;Energy  conservation;Dry needling;Passive range of motion    PT Next Visit Plan reassess HEP; Berg, assess dizziness, M-CTSIB, gait training with cane/walker    Consulted and Agree with Plan of Care Patient           Patient will benefit from skilled therapeutic intervention in order to improve the following deficits and impairments:  Abnormal gait, Decreased activity tolerance, Impaired UE functional use, Decreased strength, Decreased balance, Difficulty walking, Improper body mechanics, Impaired perceived functional ability, Dizziness, Decreased range of motion, Postural dysfunction, Impaired flexibility, Decreased safety awareness, Decreased coordination  Visit Diagnosis: Unsteadiness on feet  Repeated falls  Muscle weakness (generalized)  Other abnormalities of gait and mobility     Problem List Patient Active Problem List   Diagnosis Date Noted  . Weakness of right upper extremity 03/08/2020  . Dysarthria 03/08/2020  . Essential hypertension 03/08/2020  . Major depressive disorder 03/08/2020  . Panic disorder 03/08/2020  . Type 2 diabetes mellitus with complication, without long-term current use of insulin ([place]) 03/08/2020  . Mixed diabetic hyperlipidemia associated with type 2 diabetes mellitus ([place]) 03/08/2020  . CVA (cerebral vascular accident) ([place]) 03/08/2020     Janene Harvey, PT, DPT 03/24/20 4:03 PM   [facility] [address]  [place], [place], [address] Phone: [phone]   Fax:  [phone]  Name: Peggy Ryan MRN: [id number] Date of Birth: April 24, 1961

## 2020-03-25 ENCOUNTER — Other Ambulatory Visit: Payer: Self-pay | Admitting: *Deleted

## 2020-03-25 NOTE — Patient Outreach (Signed)
[facility]) Care Management  03/25/2020  Peggy Ryan R Achilles Dunk 1961/03/05 [id number]    RED ON EMMI ALERT- Stroke Day #3 Date:10/2 Red Alert Reason:Feeling worse overall   Outreach attempt #3, unsuccessful, HIPAA compliant voice message left.   Plan: RN CM will follow up within the next 3 weeks, if remain unsuccessful will close case at that time.  Valente David, [place], MSN [place] [phone]

## 2020-04-01 ENCOUNTER — Other Ambulatory Visit: Payer: Self-pay

## 2020-04-01 ENCOUNTER — Ambulatory Visit: Payer: Medicare Other

## 2020-04-01 DIAGNOSIS — R296 Repeated falls: Secondary | ICD-10-CM

## 2020-04-01 DIAGNOSIS — R2689 Other abnormalities of gait and mobility: Secondary | ICD-10-CM

## 2020-04-01 DIAGNOSIS — M6281 Muscle weakness (generalized): Secondary | ICD-10-CM

## 2020-04-01 DIAGNOSIS — R2681 Unsteadiness on feet: Secondary | ICD-10-CM | POA: Diagnosis not present

## 2020-04-01 NOTE — Therapy (Signed)
[facility] [address]  [place], [place], [address] Phone: [phone]   Fax:  [phone]  Physical Therapy Treatment  Patient Details  Name: Peggy Ryan MRN: [id number] Date of Birth: June 07, 1961 Referring Provider (PT): Berle Mull, MD   Encounter Date: 04/01/2020   PT End of Session - 04/01/20 1455    Visit Number 2    Number of Visits 13    Date for PT Re-Evaluation 05/05/20    Authorization Type [employer] Medicare    PT Start Time 1451   Pt. arrived late   PT Stop Time 1529    PT Time Calculation (min) 38 min    Activity Tolerance Patient tolerated treatment well    Behavior During Therapy [facility] for tasks assessed/performed           Past Medical History:  Diagnosis Date  . Anxiety   . Diabetes mellitus   . Panic attacks     Past Surgical History:  Procedure Laterality Date  . CARPAL TUNNEL RELEASE    . CHOLECYSTECTOMY    . TONSILLECTOMY      There were no vitals filed for this visit.   Subjective Assessment - 04/01/20 1456    Subjective Reports that she is not using AD for support.    Pertinent History panic attacks, DM, anxiety, carpal tunnel release    Diagnostic tests 03/08/20 brain MRI: Acute/early subacute left pontine infarct with mild associated edema. No substantial mass effect. No hydrocephalus.    Patient Stated Goals *work on walking, getting less dizzy, and weakness"    Currently in Pain? Yes    Pain Score 0-No pain   up to 8-9/10 "tingling' in L arm sometimes   Pain Location Arm    Pain Orientation Left    Pain Descriptors / Indicators Tingling    Pain Type Acute pain    Aggravating Factors  unsure    Multiple Pain Sites No              [facility] PT Assessment - 04/01/20 0001      Balance   Balance Assessed Yes      Standardized Balance Assessment   Standardized Balance Assessment Berg Balance Test      Berg Balance Test   Sit to Stand Able to stand without using  hands and stabilize independently    Standing Unsupported Able to stand 2 minutes with supervision    Sitting with Back Unsupported but Feet Supported on Floor or Stool Able to sit safely and securely 2 minutes    Stand to Sit Sits safely with minimal use of hands    Transfers Able to transfer safely, minor use of hands    Standing Unsupported with Eyes Closed Able to stand 10 seconds with supervision    Standing Unsupported with Feet Together Able to place feet together independently and stand for 1 minute with supervision    From Standing, Reach Forward with Outstretched Arm Can reach forward >12 cm safely (5")    From Standing Position, Pick up Object from Floor Able to pick up shoe, needs supervision    From Standing Position, Turn to Look Behind Over each Shoulder Looks behind one side only/other side shows less weight shift    Turn 360 Degrees Needs close supervision or verbal cueing   Able to turn 360 circle both ways <4 sec needs supervision   Standing Unsupported, Alternately Place Feet on Step/Stool Able to complete 4 steps without  aid or supervision    Standing Unsupported, One Foot in Front Able to plae foot ahead of the other independently and hold 30 seconds    Standing on One Leg Able to lift leg independently and hold equal to or more than 3 seconds    Total Score 42                         [facility] Adult PT Treatment/Exercise - 04/01/20 0001      Ambulation/Gait   Ambulation/Gait Yes    Ambulation/Gait Assistance 5: Supervision    Ambulation/Gait Assistance Details Cues for proper sequencing with SPC for safety; supervision provided     Ambulation Distance (Feet) 90 Feet    Assistive device Straight cane    Gait Pattern Step-through pattern;Decreased step length - right;Decreased step length - left;Lateral trunk lean to right;Lateral trunk lean to left    Ambulation Surface Level;Indoor      Knee/Hip Exercises: Aerobic   Nustep Lvl 3, 8 min (UE/LE)                      PT Short Term Goals - 04/01/20 1534      PT SHORT TERM GOAL #1   Title Patient to be independent with initial HEP.    Time 2    Period Weeks    Status On-going    Target Date 04/07/20             PT Long Term Goals - 04/01/20 1534      PT LONG TERM GOAL #1   Title Patient to be independent with advanced HEP.    Time 6    Period Weeks    Status On-going      PT LONG TERM GOAL #2   Title Patient to demonstrate R UE strength >/=4+/5 and symmetrical grip strength.    Time 6    Period Weeks    Status On-going      PT LONG TERM GOAL #3   Title Patient to demonstrate R LE strength >/=4+/5.    Time 6    Period Weeks    Status On-going      PT LONG TERM GOAL #4   Title Patient to score >/=46/56 on Berg in order to demonstrate a decreased risk of falls.    Time 6    Period Weeks    Status On-going   10/20: 42/56     PT LONG TERM GOAL #5   Title Patient to score >19/24 on DGI in order to demonstrate a decreased risk of falls.    Time 6    Period Weeks    Status On-going      PT LONG TERM GOAL #6   Title Patient to demonstrate R ankle dorsiflexion AROM aleast 10 degrees.    Time 6    Period Weeks    Status On-going      PT LONG TERM GOAL #7   Title Patient to report 75% improvement in dizziness with ADLs.    Time 6    Period Weeks    Status On-going                 Plan - 04/01/20 1459    Clinical Impression Statement Peggy Ryan seen to start session noting no issues with HEP however seen walking into clinic without AD and significant instability holding onto walls.  Pt. strongly encouraged to use SPC when in home and community for  safety.  Did practice using SPC with pt. today with heavy cueing required for proper sequencing however some carryover with improved safety with AD.  BERG balance exam scoring at 42/56 today demonstrating significant fall risk with narrow BOS and SLS activities requiring CGA/supervision from therapist for safety.   Ended visit with pt. verbalizing plans to bring [facility] for safety in coming sessions.     Comorbidities panic attacks, DM, anxiety, carpal tunnel release    Rehab Potential Good    PT Frequency 2x / week    PT Duration 6 weeks    PT Treatment/Interventions ADLs/Self Care Home Management;Canalith Repostioning;Cryotherapy;Electrical Stimulation;Moist Heat;Balance training;Therapeutic exercise;Therapeutic activities;Functional mobility training;Stair training;Gait training;DME Instruction;Ultrasound;Neuromuscular re-education;Patient/family education;Manual techniques;Vestibular;Taping;Energy conservation;Dry needling;Passive range of motion    PT Next Visit Plan Assess dizziness, M-CTSIB, gait training with cane/walker    Consulted and Agree with Plan of Care Patient           Patient will benefit from skilled therapeutic intervention in order to improve the following deficits and impairments:  Abnormal gait, Decreased activity tolerance, Impaired UE functional use, Decreased strength, Decreased balance, Difficulty walking, Improper body mechanics, Impaired perceived functional ability, Dizziness, Decreased range of motion, Postural dysfunction, Impaired flexibility, Decreased safety awareness, Decreased coordination  Visit Diagnosis: Unsteadiness on feet  Repeated falls  Muscle weakness (generalized)  Other abnormalities of gait and mobility     Problem List Patient Active Problem List   Diagnosis Date Noted  . Weakness of right upper extremity 03/08/2020  . Dysarthria 03/08/2020  . Essential hypertension 03/08/2020  . Major depressive disorder 03/08/2020  . Panic disorder 03/08/2020  . Type 2 diabetes mellitus with complication, without long-term current use of insulin ([place]) 03/08/2020  . Mixed diabetic hyperlipidemia associated with type 2 diabetes mellitus ([place]) 03/08/2020  . CVA (cerebral vascular accident) [facility]) 03/08/2020    Bess Harvest, PTA 04/01/20 6:43 PM   [facility] [address]  [place], [place], [address] Phone: [phone]   Fax:  [phone]  Name: Peggy Ryan MRN: [id number] Date of Birth: July 04, 1960

## 2020-04-08 ENCOUNTER — Ambulatory Visit: Payer: Medicare Other

## 2020-04-08 ENCOUNTER — Other Ambulatory Visit: Payer: Self-pay

## 2020-04-08 DIAGNOSIS — R296 Repeated falls: Secondary | ICD-10-CM

## 2020-04-08 DIAGNOSIS — M6281 Muscle weakness (generalized): Secondary | ICD-10-CM

## 2020-04-08 DIAGNOSIS — R2681 Unsteadiness on feet: Secondary | ICD-10-CM | POA: Diagnosis not present

## 2020-04-08 DIAGNOSIS — R2689 Other abnormalities of gait and mobility: Secondary | ICD-10-CM

## 2020-04-08 NOTE — Therapy (Addendum)
[facility] [address]  [place], [place], [address] Phone: [phone]   Fax:  [phone]  Physical Therapy Treatment  Patient Details  Name: Peggy Ryan MRN: [id number] Date of Birth: [birth year] Referring Provider (PT): Berle Mull, MD   Progress Note Reporting Period 03/24/20 to 04/08/20  See note below for Objective Data and Assessment of Progress/Goals.     Encounter Date: 04/08/2020   PT End of Session - 04/08/20 1508    Visit Number 3    Number of Visits 13    Date for PT Re-Evaluation 05/05/20    Authorization Type [employer]    PT Start Time 1450    PT Stop Time 1530    PT Time Calculation (min) 40 min    Activity Tolerance Patient tolerated treatment well    Behavior During Therapy WFL for tasks assessed/performed           Past Medical History:  Diagnosis Date  . Anxiety   . Diabetes mellitus   . Panic attacks     Past Surgical History:  Procedure Laterality Date  . CARPAL TUNNEL RELEASE    . CHOLECYSTECTOMY    . TONSILLECTOMY      There were no vitals filed for this visit.   Subjective Assessment - 04/08/20 1500    Subjective Pt. noting some muscular soreness after last visit which subsided.    Pertinent History panic attacks, DM, anxiety, carpal tunnel release    Diagnostic tests 03/08/20 brain MRI: Acute/early subacute left pontine infarct with mild associated edema. No substantial mass effect. No hydrocephalus.    Patient Stated Goals *work on walking, getting less dizzy, and weakness"    Currently in Pain? No/denies    Pain Score 0-No pain                             [facility] Adult PT Treatment/Exercise - 04/08/20 0001      Neuro Re-ed    Neuro Re-ed Details  with counter support and therapist supervision: side stepping at counter x 3 laps, tandem walk forward x 3 laps, backwards walk x 3 laps, backwards tandem walk at counter with close  therapist S x 3 laps;  Trial of Alternating cone knock over/righting each LE with SPC x 7 cones - required therapist CGA for balance x 2       Knee/Hip Exercises: Stretches   Gastroc Stretch Right;Left;1 rep;30 seconds    Gastroc Stretch Limitations standing leaning into counter       Knee/Hip Exercises: Aerobic   Nustep Lvl 3, 8 min (UE/LE)      Knee/Hip Exercises: Standing   Heel Raises Both;15 reps    Heel Raises Limitations standing at counter                     PT Short Term Goals - 04/08/20 1511      PT SHORT TERM GOAL #1   Title Patient to be independent with initial HEP.    Time 2    Period Weeks    Status Achieved    Target Date 04/07/20             PT Long Term Goals - 04/01/20 1534      PT LONG TERM GOAL #1   Title Patient to be independent with advanced HEP.    Time 6    Period Weeks  Status On-going      PT LONG TERM GOAL #2   Title Patient to demonstrate R UE strength >/=4+/5 and symmetrical grip strength.    Time 6    Period Weeks    Status On-going      PT LONG TERM GOAL #3   Title Patient to demonstrate R LE strength >/=4+/5.    Time 6    Period Weeks    Status On-going      PT LONG TERM GOAL #4   Title Patient to score >/=46/56 on [place] in order to demonstrate a decreased risk of falls.    Time 6    Period Weeks    Status On-going   10/20: 42/56     PT LONG TERM GOAL #5   Title Patient to score >19/24 on DGI in order to demonstrate a decreased risk of falls.    Time 6    Period Weeks    Status On-going      PT LONG TERM GOAL #6   Title Patient to demonstrate R ankle dorsiflexion AROM aleast 10 degrees.    Time 6    Period Weeks    Status On-going      PT LONG TERM GOAL #7   Title Patient to report 75% improvement in dizziness with ADLs.    Time 6    Period Weeks    Status On-going                 Plan - 04/08/20 1512    Clinical Impression Statement Pt. reporting no issues with HEP performing daily.  STG  #1 met.  Progressed dynamic balance activities with tandem stance and SLS activities with therapist supervision/CGA at times for balance.  Peggy Ryan impulsive at times walking with [facility] requiring close CGA/supervision for safety.  Did not bring her [facility] as recommended for safety by therapist last session thus borrowed clinic [facility] today.  Will consider HEP update in coming sessions.    Comorbidities panic attacks, DM, anxiety, carpal tunnel release    Rehab Potential Good    PT Frequency 2x / week    PT Duration 6 weeks    PT Treatment/Interventions ADLs/Self Care Home Management;Canalith Repostioning;Cryotherapy;Electrical Stimulation;Moist Heat;Balance training;Therapeutic exercise;Therapeutic activities;Functional mobility training;Stair training;Gait training;DME Instruction;Ultrasound;Neuromuscular re-education;Patient/family education;Manual techniques;Vestibular;Taping;Energy conservation;Dry needling;Passive range of motion    PT Next Visit Plan Assess dizziness, M-CTSIB, gait training with cane/walker    Consulted and Agree with Plan of Care Patient           Patient will benefit from skilled therapeutic intervention in order to improve the following deficits and impairments:  Abnormal gait, Decreased activity tolerance, Impaired UE functional use, Decreased strength, Decreased balance, Difficulty walking, Improper body mechanics, Impaired perceived functional ability, Dizziness, Decreased range of motion, Postural dysfunction, Impaired flexibility, Decreased safety awareness, Decreased coordination  Visit Diagnosis: Unsteadiness on feet  Repeated falls  Muscle weakness (generalized)  Other abnormalities of gait and mobility     Problem List Patient Active Problem List   Diagnosis Date Noted  . Weakness of right upper extremity 03/08/2020  . Dysarthria 03/08/2020  . Essential hypertension 03/08/2020  . Major depressive disorder 03/08/2020  . Panic disorder 03/08/2020  . Type 2  diabetes mellitus with complication, without long-term current use of insulin ([place]) 03/08/2020  . Mixed diabetic hyperlipidemia associated with type 2 diabetes mellitus ([place]) 03/08/2020  . CVA (cerebral vascular accident) [facility]) 03/08/2020    Bess Harvest, PTA 04/08/20 4:04 PM   [facility] [address]  [place], [place], [address] Phone: [phone]   Fax:  [phone]  Name: Peggy Ryan MRN: [id number] Date of Birth: [birth year]   PHYSICAL THERAPY DISCHARGE SUMMARY  Visits from Start of Care: 3  Current functional level related to goals / functional outcomes: Unable to assess; patient cancelled all remaining appointments    Remaining deficits: Unable to assess   Education / Equipment: HEP  Plan: Patient agrees to discharge.  Patient goals were not met. Patient is being discharged due to not returning since the last visit.  ?????     Janene Harvey, PT, DPT 05/18/20 1:46 PM

## 2020-04-15 ENCOUNTER — Encounter: Payer: Medicare Other | Admitting: Physical Therapy

## 2020-04-15 ENCOUNTER — Other Ambulatory Visit: Payer: Self-pay | Admitting: *Deleted

## 2020-04-15 NOTE — Patient Outreach (Signed)
[facility]) Care Management  04/15/2020  Peggy Ryan Oct 30, 1960 [id number]   Outreach attempt #4, unsuccessful, HIPAA compliant voice message left.  No response from member after multiple unsuccessful outreach attempts and letter sent.  Will close case at this time due to inability to maintain contact.  Will notify member and primary MD of case closure.  Valente David, [place], MSN [place] [phone]

## 2020-04-22 ENCOUNTER — Encounter: Payer: Medicare Other | Admitting: Physical Therapy

## 2020-04-29 ENCOUNTER — Ambulatory Visit: Payer: Medicare Other | Admitting: Physical Therapy

## 2022-01-05 ENCOUNTER — Other Ambulatory Visit: Payer: Self-pay | Admitting: Internal Medicine

## 2022-01-05 DIAGNOSIS — R0789 Other chest pain: Secondary | ICD-10-CM

## 2022-07-23 IMAGING — CT CT HEAD W/O CM
3 of 4 series · 14 of 47 positions shown, 16 images · non-contrast
Comparison: None.

CLINICAL DATA: Altered sensation of the right side of face, concern
for stroke

EXAM:
CT HEAD WITHOUT CONTRAST
TECHNIQUE: Contiguous axial images were obtained from the base of the skull
through the vertex without intravenous contrast.

[Series 5: coronal soft tissue · coronal · 0.33mm/px · 3 of 66 slices shown]
[im 22/66  brain]
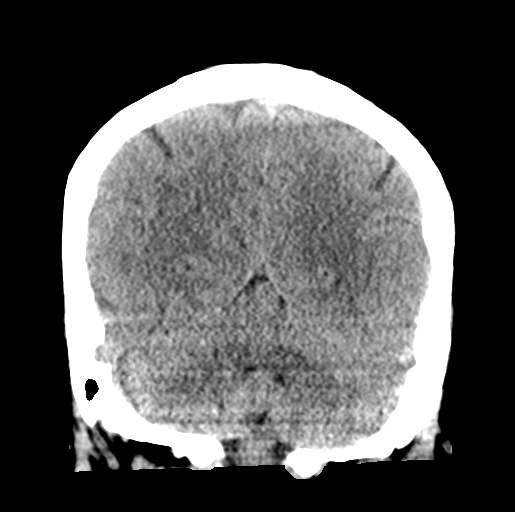
[im 29/66  brain]
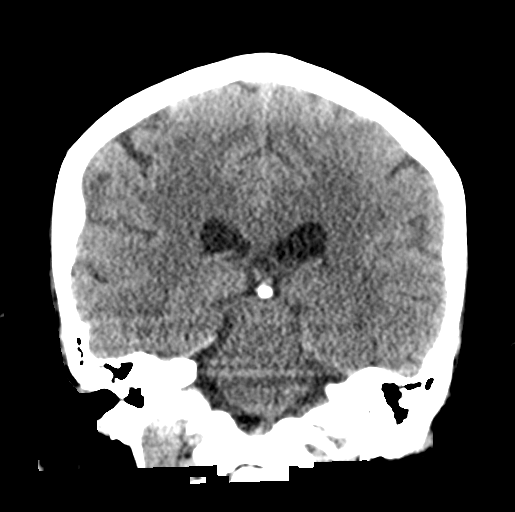
[im 37/66  brain]
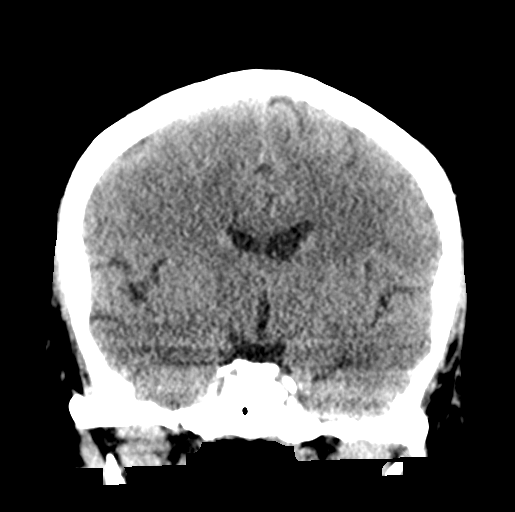

[Series 6: sagittal soft tissue · sagittal · 0.33mm/px · 3 of 59 slices shown]
[im 20/59  brain]
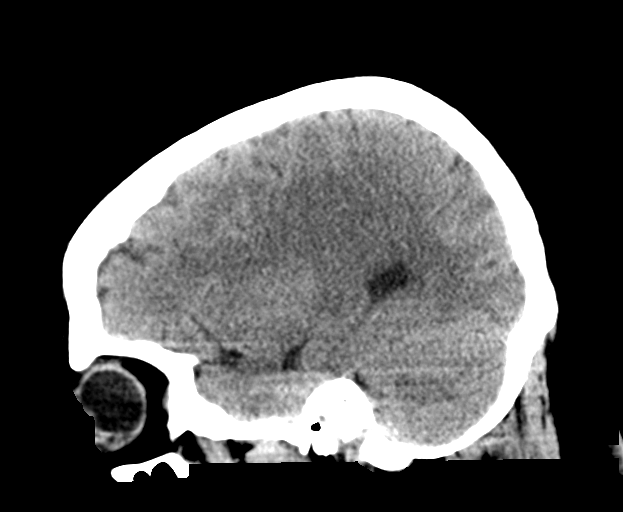
[im 30/59  brain]
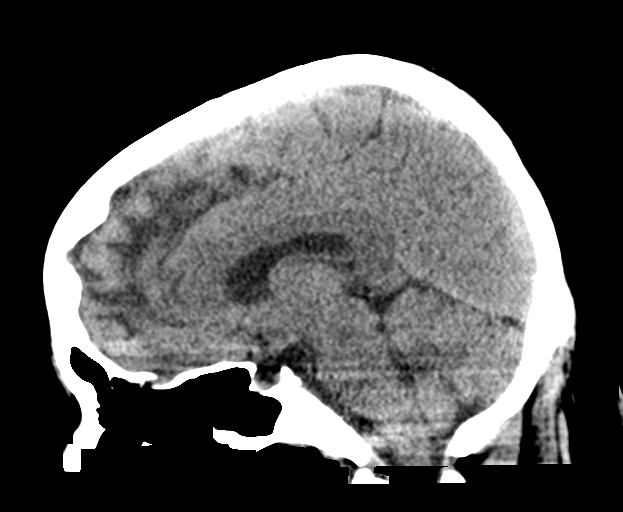
[im 39/59  brain]
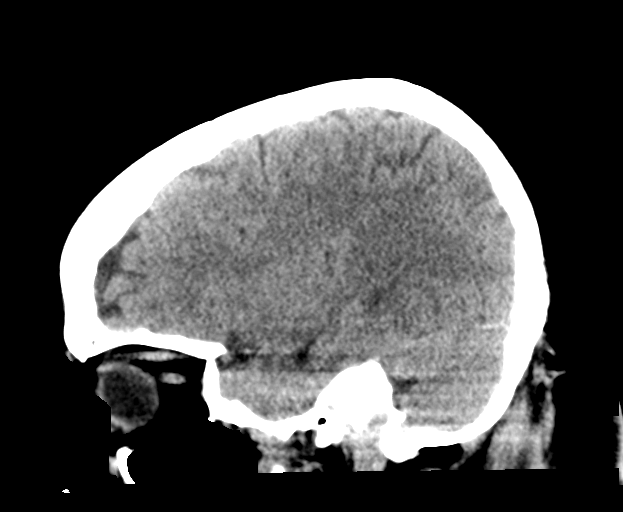

[Series 7: true axial · axial · 0.34mm/px · z∈[-112,+12]mm · 8 of 56 slices shown, 10 images]
[im 7/56  brain]
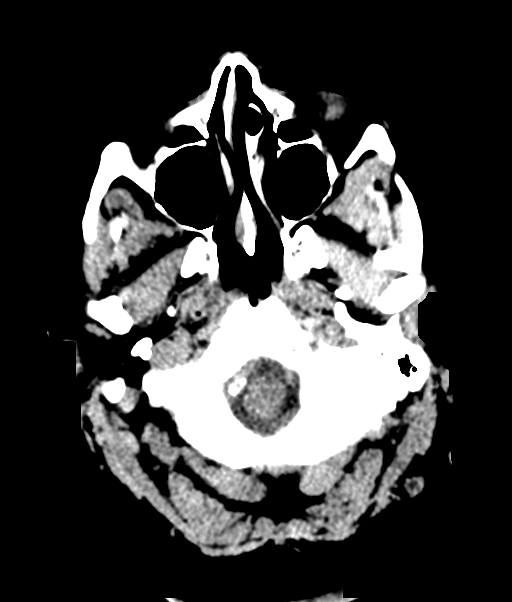
[im 7/56  bone]
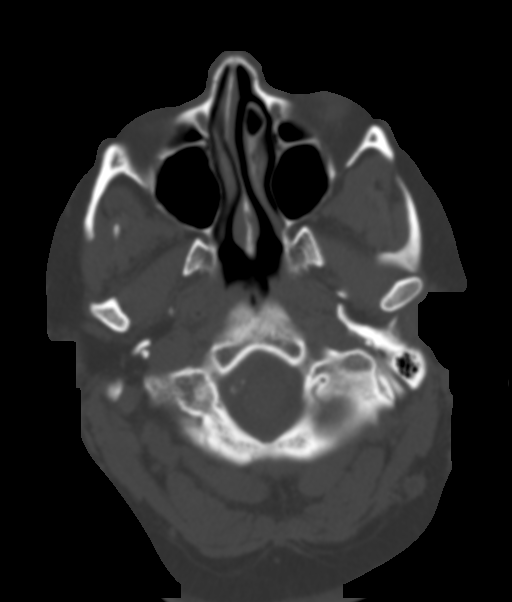
[im 13/56  brain]
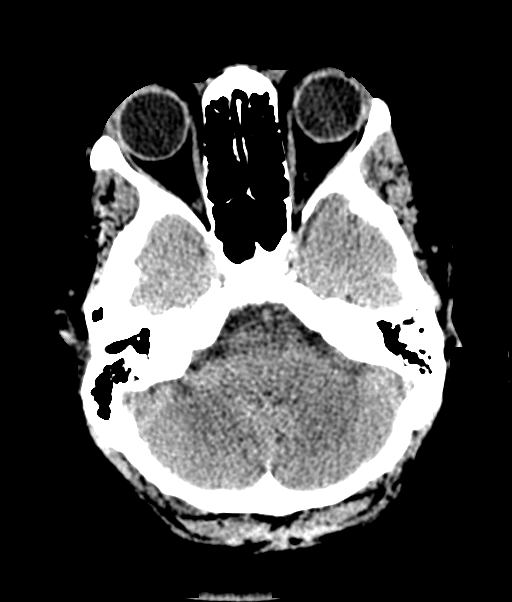
[im 19/56  brain]
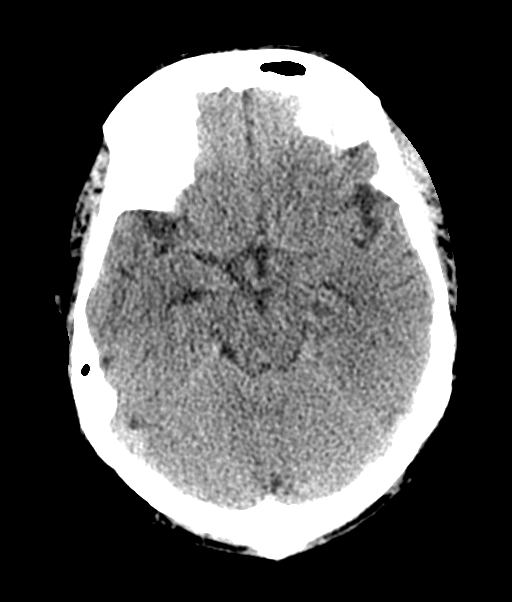
[im 25/56  brain]
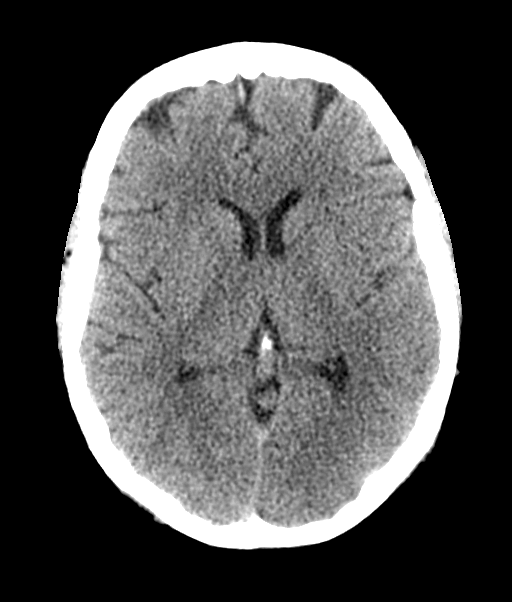
[im 31/56  brain]
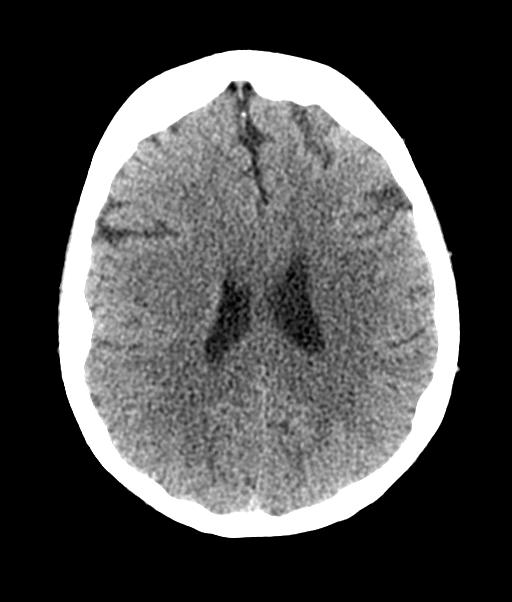
[im 31/56  bone]
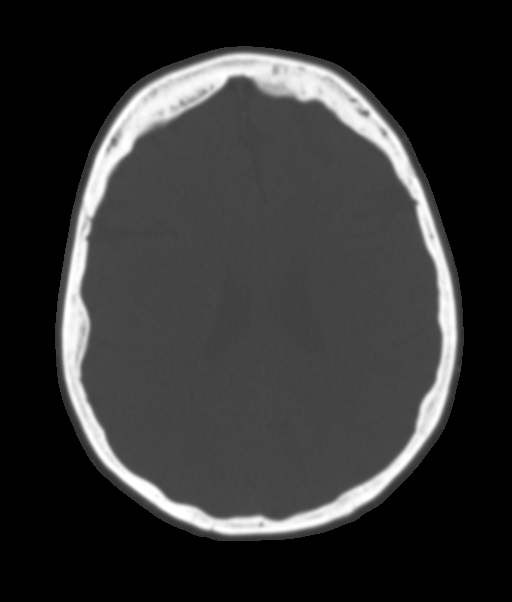
[im 37/56  brain]
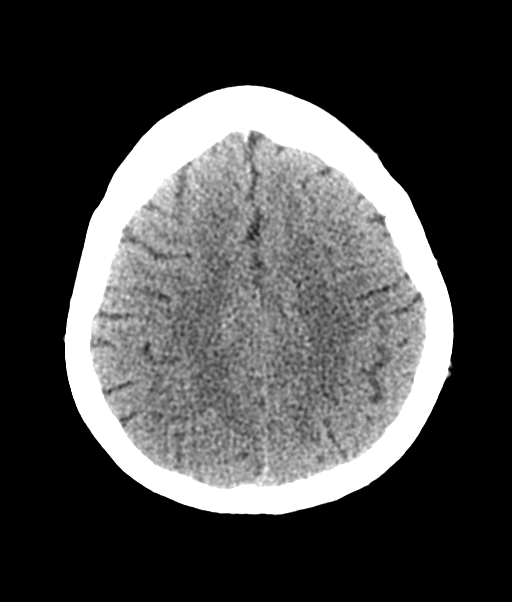
[im 43/56  brain]
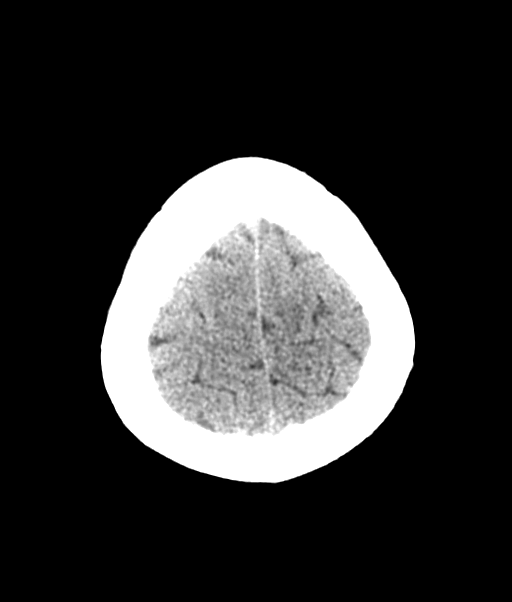
[im 49/56  brain]
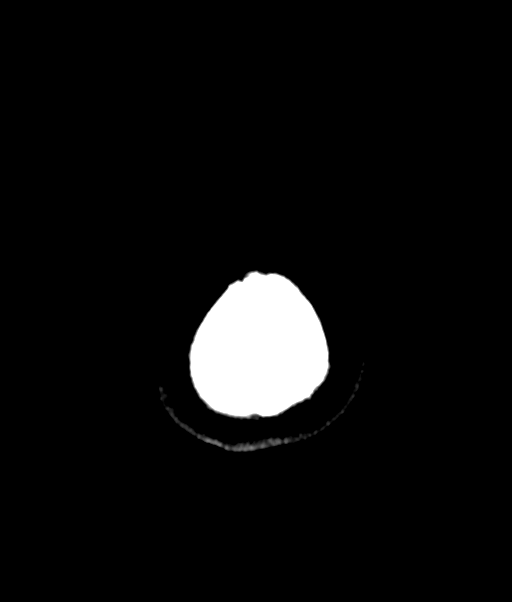

[14 of 47 positions shown; findings below may reference images not displayed]

FINDINGS: Brain: No evidence of acute infarction, hemorrhage, hydrocephalus,
extra-axial collection, visible mass lesion or mass effect.

Vascular: Atherosclerotic calcification of the carotid siphons and
right intradural vertebral artery. No hyperdense vessel.

Skull: No calvarial fracture or suspicious osseous lesion. No scalp
swelling or hematoma.

Sinuses/Orbits: Paranasal sinuses and mastoid air cells are
predominantly clear. Included orbital structures are unremarkable.

Other: None
IMPRESSION: No acute intracranial abnormality. If there is persisting clinical
concern for infarct, MRI is more sensitive and specific for early
changes of ischemia.

Intracranial atherosclerosis.

## 2023-01-18 DIAGNOSIS — I517 Cardiomegaly: Secondary | ICD-10-CM | POA: Diagnosis not present

## 2023-01-18 DIAGNOSIS — E785 Hyperlipidemia, unspecified: Secondary | ICD-10-CM | POA: Diagnosis not present

## 2023-01-18 DIAGNOSIS — Z1212 Encounter for screening for malignant neoplasm of rectum: Secondary | ICD-10-CM | POA: Diagnosis not present

## 2023-01-18 DIAGNOSIS — I119 Hypertensive heart disease without heart failure: Secondary | ICD-10-CM | POA: Diagnosis not present

## 2023-01-18 DIAGNOSIS — E1165 Type 2 diabetes mellitus with hyperglycemia: Secondary | ICD-10-CM | POA: Diagnosis not present

## 2023-01-18 DIAGNOSIS — I700 Atherosclerosis of aorta: Secondary | ICD-10-CM | POA: Diagnosis not present

## 2023-01-25 DIAGNOSIS — I517 Cardiomegaly: Secondary | ICD-10-CM | POA: Diagnosis not present

## 2023-01-25 DIAGNOSIS — I10 Essential (primary) hypertension: Secondary | ICD-10-CM | POA: Diagnosis not present

## 2023-01-25 DIAGNOSIS — Z0000 Encounter for general adult medical examination without abnormal findings: Secondary | ICD-10-CM | POA: Diagnosis not present

## 2023-01-25 DIAGNOSIS — I6509 Occlusion and stenosis of unspecified vertebral artery: Secondary | ICD-10-CM | POA: Diagnosis not present

## 2023-01-25 DIAGNOSIS — I6990 Unspecified sequelae of unspecified cerebrovascular disease: Secondary | ICD-10-CM | POA: Diagnosis not present

## 2023-01-25 DIAGNOSIS — E785 Hyperlipidemia, unspecified: Secondary | ICD-10-CM | POA: Diagnosis not present

## 2023-01-25 DIAGNOSIS — E1165 Type 2 diabetes mellitus with hyperglycemia: Secondary | ICD-10-CM | POA: Diagnosis not present

## 2023-01-25 DIAGNOSIS — I6529 Occlusion and stenosis of unspecified carotid artery: Secondary | ICD-10-CM | POA: Diagnosis not present

## 2023-01-25 DIAGNOSIS — I700 Atherosclerosis of aorta: Secondary | ICD-10-CM | POA: Diagnosis not present

## 2023-05-23 DIAGNOSIS — F332 Major depressive disorder, recurrent severe without psychotic features: Secondary | ICD-10-CM | POA: Diagnosis not present

## 2023-07-25 DIAGNOSIS — L258 Unspecified contact dermatitis due to other agents: Secondary | ICD-10-CM | POA: Diagnosis not present

## 2023-07-25 DIAGNOSIS — D229 Melanocytic nevi, unspecified: Secondary | ICD-10-CM | POA: Diagnosis not present

## 2023-07-25 DIAGNOSIS — D225 Melanocytic nevi of trunk: Secondary | ICD-10-CM | POA: Diagnosis not present

## 2023-10-09 DIAGNOSIS — E785 Hyperlipidemia, unspecified: Secondary | ICD-10-CM | POA: Diagnosis not present

## 2023-10-09 DIAGNOSIS — F17210 Nicotine dependence, cigarettes, uncomplicated: Secondary | ICD-10-CM | POA: Diagnosis not present

## 2023-10-09 DIAGNOSIS — F410 Panic disorder [episodic paroxysmal anxiety] without agoraphobia: Secondary | ICD-10-CM | POA: Diagnosis not present

## 2023-10-09 DIAGNOSIS — I10 Essential (primary) hypertension: Secondary | ICD-10-CM | POA: Diagnosis not present

## 2023-10-09 DIAGNOSIS — E1165 Type 2 diabetes mellitus with hyperglycemia: Secondary | ICD-10-CM | POA: Diagnosis not present

## 2023-10-09 DIAGNOSIS — I739 Peripheral vascular disease, unspecified: Secondary | ICD-10-CM | POA: Diagnosis not present

## 2023-10-09 DIAGNOSIS — F329 Major depressive disorder, single episode, unspecified: Secondary | ICD-10-CM | POA: Diagnosis not present

## 2023-10-09 DIAGNOSIS — I517 Cardiomegaly: Secondary | ICD-10-CM | POA: Diagnosis not present

## 2023-10-09 DIAGNOSIS — I6529 Occlusion and stenosis of unspecified carotid artery: Secondary | ICD-10-CM | POA: Diagnosis not present

## 2023-11-21 DIAGNOSIS — F332 Major depressive disorder, recurrent severe without psychotic features: Secondary | ICD-10-CM | POA: Diagnosis not present
